# Patient Record
Sex: Female | Born: 2000 | Hispanic: No | Smoking: Never smoker
Health system: Southern US, Community
[De-identification: ages and names within clinical notes are randomized; demographics above are authoritative.]

## PROBLEM LIST (undated history)

## (undated) DIAGNOSIS — N83209 Unspecified ovarian cyst, unspecified side: Secondary | ICD-10-CM

## (undated) HISTORY — DX: Unspecified ovarian cyst, unspecified side: N83.209

---
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
## 2018-03-18 ENCOUNTER — Emergency Department
Admission: EM | Admit: 2018-03-18 | Discharge: 2018-03-18 | Disposition: A | Discharge status: Home / Self Care | Payer: Self-pay | Attending: Emergency Medicine | Admitting: Emergency Medicine

## 2018-03-18 ENCOUNTER — Emergency Department: Payer: Self-pay

## 2018-03-18 ENCOUNTER — Encounter: Payer: Self-pay | Admitting: Emergency Medicine

## 2018-03-18 ENCOUNTER — Other Ambulatory Visit: Payer: Self-pay

## 2018-03-18 DIAGNOSIS — R110 Nausea: Secondary | ICD-10-CM | POA: Insufficient documentation

## 2018-03-18 DIAGNOSIS — R102 Pelvic and perineal pain: Secondary | ICD-10-CM | POA: Insufficient documentation

## 2018-03-18 DIAGNOSIS — N83201 Unspecified ovarian cyst, right side: Secondary | ICD-10-CM | POA: Insufficient documentation

## 2018-03-18 LAB — COMPREHENSIVE METABOLIC PANEL
ALT: 22 U/L (ref 0–44)
AST: 20 U/L (ref 15–41)
Albumin: 4.2 g/dL (ref 3.5–5.0)
Alkaline Phosphatase: 42 U/L (ref 38–126)
Anion gap: 8 (ref 5–15)
BILIRUBIN TOTAL: 0.6 mg/dL (ref 0.3–1.2)
BUN: 12 mg/dL (ref 6–20)
CO2: 24 mmol/L (ref 22–32)
Calcium: 9.1 mg/dL (ref 8.9–10.3)
Chloride: 105 mmol/L (ref 98–111)
Creatinine, Ser: 0.63 mg/dL (ref 0.44–1.00)
GFR calc Af Amer: >60 mL/min (ref >60)
GFR calc non Af Amer: >60 mL/min (ref >60)
GLUCOSE: 90 mg/dL (ref 70–99)
Potassium: 3.9 mmol/L (ref 3.5–5.1)
Sodium: 137 mmol/L (ref 135–145)
TOTAL PROTEIN: 7.2 g/dL (ref 6.5–8.1)

## 2018-03-18 LAB — CBC
HCT: 43.4 % (ref 36.0–46.0)
Hemoglobin: 14.4 g/dL (ref 12.0–15.0)
MCH: 28.1 pg (ref 26.0–34.0)
MCHC: 33.2 g/dL (ref 30.0–36.0)
MCV: 84.8 fL (ref 80.0–100.0)
Platelets: 164 10*3/uL (ref 150–400)
RBC: 5.12 MIL/uL — ABNORMAL HIGH (ref 3.87–5.11)
RDW: 12.1 % (ref 11.5–15.5)
WBC: 4.1 10*3/uL (ref 4.0–10.5)
nRBC: 0 % (ref 0.0–0.2)

## 2018-03-18 LAB — LIPASE, BLOOD: Lipase: 26 U/L (ref 11–51)

## 2018-03-18 LAB — POCT PREGNANCY, URINE: Preg Test, Ur: NEGATIVE

## 2018-03-18 MED ORDER — SODIUM CHLORIDE 0.9% FLUSH: 3.0000 mL | Freq: Once | INTRAVENOUS | Status: DC

## 2018-03-18 MED ORDER — DROSPIRENONE-ETHINYL ESTRADIOL 3-0.02 MG PO TABS: 1.0000 | ORAL_TABLET | Freq: Every day | ORAL | 2 refills | Status: DC

## 2018-03-18 MED ORDER — IBUPROFEN 600 MG PO TABS: 600.0000 mg | ORAL_TABLET | Freq: Four times a day (QID) | ORAL | 0 refills | Status: DC | PRN

## 2018-03-18 NOTE — ED Notes (Signed)
Urine to lab

## 2018-03-18 NOTE — Discharge Instructions (Signed)
Take the Yaz as prescribed.  You can take ibuprofen every 6 hours as needed for pain.  Return to the ER for new, worsening, or persistent severe abdominal pain, vomiting, fever, or any other new or worsening symptoms that concern you.  Follow-up with [facility].

## 2018-03-18 NOTE — ED Notes (Signed)
Gave pt urine cup. Waiting on sample

## 2018-03-18 NOTE — ED Triage Notes (Signed)
Pt presents to ED with c/o RLQ abdominal pain. Pt states hx of ovarian cysts with hx of cyst rupture. Pt states pain started several days ago with sudden worsening today. Pt ambulatory without difficulty at this time.

## 2018-03-18 NOTE — ED Provider Notes (Signed)
[facility] [place] Emergency Department Provider Note ____________________________________________   First MD Initiated Contact with Patient 03/18/18 1452     (approximate)  I have reviewed the triage vital signs and the nursing notes.   HISTORY  Chief Complaint Abdominal Pain    HPI Kelly Harrell is a 18 y.o. female with PMH as noted below who presents with right suprapubic pain over the last 4 days, gradual onset, worsening course, associated with mild nausea but not associated with vomiting, anorexia, diarrhea, fever, urinary symptoms, or any vaginal bleeding or discharge.  She states it feels similar to when she had an ovarian cyst rupture in the past.  Past Medical History:  Diagnosis Date  . Ovarian cyst     Patient Active Problem List   Diagnosis Date Noted  . Encounter for surveillance of contraceptive pills 05/18/2017  . Hemorrhagic ovarian cyst 10/26/2016  . Abdominal pain, chronic, right lower quadrant 10/26/2016    Past Surgical History:  Procedure Laterality Date  . LAPAROSCOPY N/A 10/26/2016   Procedure: LAPAROSCOPY DIAGNOSTIC;  Surgeon: Nadara Mustard, MD;  Location: [facility] ORS;  Service: Gynecology;  Laterality: N/A;    Prior to Admission medications   Medication Sig Start Date End Date Taking? Authorizing Provider  benzonatate (TESSALON PERLES) 100 MG capsule Take 2 capsules (200 mg total) by mouth 3 (three) times daily as needed. 12/12/17 12/12/18  Joni Reining, PA-C  chlorpheniramine-HYDROcodone (TUSSIONEX PENNKINETIC ER) 10-8 MG/5ML SUER Take 5 mLs by mouth 2 (two) times daily. 12/12/17   Joni Reining, PA-C  drospirenone-ethinyl estradiol Pierre Bali) 3-0.02 MG tablet Take 1 tablet by mouth daily. 03/18/18 06/16/18  Dionne Bucy, MD  ibuprofen (ADVIL,MOTRIN) 600 MG tablet Take 1 tablet (600 mg total) by mouth every 6 (six) hours as needed. 03/18/18   Dionne Bucy, MD  methylPREDNISolone (MEDROL DOSEPAK) 4 MG  TBPK tablet Take Tapered dose as directed 12/12/17   Joni Reining, PA-C    Allergies Patient has no known allergies.  Family History  Problem Relation Age of Onset  . Hypertension Mother   . Diabetes Mother     Social History Social History   Tobacco Use  . Smoking status: Never Smoker  . Smokeless tobacco: Never Used  Substance Use Topics  . Alcohol use: No  . Drug use: No    Review of Systems  Constitutional: No fever. Eyes: No redness. ENT: No sore throat. Cardiovascular: Denies chest pain. Respiratory: Denies shortness of breath. Gastrointestinal: Positive for nausea.  No vomiting or diarrhea.  Genitourinary: Negative for dysuria or vaginal bleeding.  Musculoskeletal: Negative for back pain. Skin: Negative for rash. Neurological: Negative for headache.   ____________________________________________   PHYSICAL EXAM:  VITAL SIGNS: ED Triage Vitals  Enc Vitals Group     BP 03/18/18 1134 116/79     Pulse Rate 03/18/18 1134 89     Resp 03/18/18 1134 18     Temp 03/18/18 1134 98.3 F (36.8 C)     Temp Source 03/18/18 1134 Oral     SpO2 03/18/18 1134 100 %     Weight 03/18/18 1135 113 lb (51.3 kg)     Height 03/18/18 1135 5\' 4"  (1.626 m)     Head Circumference --      Peak Flow --      Pain Score 03/18/18 1134 10     Pain Loc --      Pain Edu? --      Excl. in GC? --  Constitutional: Alert and oriented. Well appearing and in no acute distress. Eyes: Conjunctivae are normal.  Head: Atraumatic. Nose: No congestion/rhinnorhea. Mouth/Throat: Mucous membranes are moist.   Neck: Normal range of motion.  Cardiovascular:  Good peripheral circulation. Respiratory: Normal respiratory effort.  No retractions. Gastrointestinal: Soft with mild right suprapubic tenderness. No distention.  Genitourinary: No flank tenderness. Musculoskeletal: Extremities warm and well perfused.  Neurologic:  Normal speech and language. No gross focal neurologic deficits are  appreciated.  Skin:  Skin is warm and dry. No rash noted. Psychiatric: Mood and affect are normal. Speech and behavior are normal.  ____________________________________________   LABS (all labs ordered are listed, but only abnormal results are displayed)  Labs Reviewed  CBC - Abnormal; Notable for the following components:      Result Value   RBC 5.12 (*)    All other components within normal limits  LIPASE, BLOOD  COMPREHENSIVE METABOLIC PANEL  URINALYSIS, COMPLETE (UACMP) WITH MICROSCOPIC  POC URINE PREG, ED  POCT PREGNANCY, URINE   ____________________________________________  EKG   ____________________________________________  RADIOLOGY  US pelvis transvaginal: Right ovarian hemorrhagic cyst with no free fluid  ____________________________________________   PROCEDURES  Procedure(s) performed: No  Procedures  Critical Care performed: No ____________________________________________   INITIAL IMPRESSION / ASSESSMENT AND PLAN / ED COURSE  Pertinent labs & imaging results that were available during my care of the patient were reviewed by me and considered in my medical decision making (see chart for details).  18 year old female with PMH as noted above including prior history of ovarian cyst presents with similar pain to her right lower abdomen over the last 4 days with mild nausea but no vomiting, fever chills, anorexia, urinary symptoms, or any bleeding or discharge.  On exam the patient is well-appearing and her vital signs are normal.  The abdomen is soft with mild right suprapubic tenderness.  Lab work-up obtained from triage is within normal limits.  Overall given the nature of the symptoms, the similarity to prior ovarian cyst related pain, the lack of fever, anorexia, or elevated WBC count, as well as the relatively inferior location of the pain, I suspect most likely ovarian cyst rather than appendicitis.  Given the patient's well appearance and the  duration and quality of the pain there is also no evidence for ovarian torsion.  We will obtain a pelvic ultrasound to further evaluate.  If the ultrasound is entirely negative I may consider CT at that time.  ----------------------------------------- 6:39 PM on 03/18/2018 -----------------------------------------  Ultrasound confirms right ovarian cyst with no surrounding free fluid or other acute abnormalities.  Appearance is unchanged from prior imaging.  The patient continues to appear comfortable and has not required any analgesia here.  At this time the patient is I discussed the case with Dr. Gaynelle Arabian from [facility] ob/gyn who agreed with my suggestion to restart the patient on Yaz which she had been taking previously.  The patient will follow up there.  Return precautions given, and she expressed understanding.  ____________________________________________   FINAL CLINICAL IMPRESSION(S) / ED DIAGNOSES  Final diagnoses:  Pelvic pain  Cyst of right ovary      NEW MEDICATIONS STARTED DURING THIS VISIT:  Discharge Medication List as of 03/18/2018  6:01 PM    START taking these medications   Details  ibuprofen (ADVIL,MOTRIN) 600 MG tablet Take 1 tablet (600 mg total) by mouth every 6 (six) hours as needed., Starting Thu 03/18/2018, Normal         Note:  This document was prepared using Dragon voice recognition software and may include unintentional dictation errors.    Dionne Bucy, MD 03/18/18 1842

## 2018-10-06 ENCOUNTER — Other Ambulatory Visit: Payer: Self-pay

## 2018-10-06 DIAGNOSIS — W228XXA Striking against or struck by other objects, initial encounter: Secondary | ICD-10-CM | POA: Insufficient documentation

## 2018-10-06 DIAGNOSIS — Z79899 Other long term (current) drug therapy: Secondary | ICD-10-CM | POA: Insufficient documentation

## 2018-10-06 DIAGNOSIS — G43801 Other migraine, not intractable, with status migrainosus: Secondary | ICD-10-CM | POA: Insufficient documentation

## 2018-10-06 DIAGNOSIS — Y929 Unspecified place or not applicable: Secondary | ICD-10-CM | POA: Insufficient documentation

## 2018-10-06 DIAGNOSIS — Y939 Activity, unspecified: Secondary | ICD-10-CM | POA: Insufficient documentation

## 2018-10-06 MED ORDER — ACETAMINOPHEN 325 MG PO TABS
650.0000 mg | ORAL_TABLET | Freq: Once | ORAL | Status: DC
  Filled 2018-10-06: qty 2

## 2018-10-06 NOTE — ED Triage Notes (Signed)
She was playing with nephew yesterday and he hit her to right forehead with a toy. Since then has had migraine, pt has hx of the same.

## 2018-10-07 ENCOUNTER — Emergency Department
Admission: EM | Admit: 2018-10-07 | Discharge: 2018-10-07 | Disposition: A | Discharge status: Home / Self Care | Payer: Medicaid Other | Attending: Emergency Medicine | Admitting: Emergency Medicine

## 2018-10-07 DIAGNOSIS — G43801 Other migraine, not intractable, with status migrainosus: Secondary | ICD-10-CM

## 2018-10-07 LAB — POCT PREGNANCY, URINE: Preg Test, Ur: NEGATIVE

## 2018-10-07 MED ORDER — KETOROLAC TROMETHAMINE 30 MG/ML IJ SOLN
15.0000 mg | Freq: Once | INTRAMUSCULAR | Status: AC
  Administered 2018-10-07: 15 mg via INTRAVENOUS
  Filled 2018-10-07: qty 1

## 2018-10-07 MED ORDER — METOCLOPRAMIDE HCL 5 MG/ML IJ SOLN
10.0000 mg | Freq: Once | INTRAMUSCULAR | Status: AC
  Administered 2018-10-07: 10 mg via INTRAVENOUS
  Filled 2018-10-07: qty 2

## 2018-10-07 MED ORDER — BUTALBITAL-APAP-CAFFEINE 50-325-40 MG PO TABS
1.0000 | ORAL_TABLET | Freq: Once | ORAL | Status: AC
  Administered 2018-10-07: 1 via ORAL
  Filled 2018-10-07: qty 1

## 2018-10-07 MED ORDER — BUTALBITAL-APAP-CAFFEINE 50-325-40 MG PO TABS: 1.0000 | ORAL_TABLET | Freq: Four times a day (QID) | ORAL | 0 refills | Status: DC | PRN

## 2018-10-07 MED ORDER — SODIUM CHLORIDE 0.9 % IV BOLUS
1000.0000 mL | Freq: Once | INTRAVENOUS | Status: AC
  Administered 2018-10-07: 1000 mL via INTRAVENOUS

## 2018-10-07 NOTE — ED Provider Notes (Signed)
[facility] Emergency Department Provider Note  ____________________________________________  Time seen: Approximately 3:44 AM  I have reviewed the triage vital signs and the nursing notes.   HISTORY  Chief Complaint Headache   HPI Kelly Harrell is a 18 y.o. female with a history of concussion and migraines who presents for evaluation of a headache.  Patient reports history of frequent headaches since having a concussion a few years ago.  Yesterday her nephew was playing with toys and threw a heavy plastic horse toy that hit her on the right side of her head.  Since then she has had headache.  She is complaining of phonophobia and photophobia and a visual aura which is consistent with her migraine headaches.  She took Tylenol at home.  She denies thunderclap headache, neck stiffness, fever, chills, syncope, changes in vision.   Past Medical History:  Diagnosis Date  . Ovarian cyst     Patient Active Problem List   Diagnosis Date Noted  . Encounter for surveillance of contraceptive pills 05/18/2017  . Hemorrhagic ovarian cyst 10/26/2016  . Abdominal pain, chronic, right lower quadrant 10/26/2016    Past Surgical History:  Procedure Laterality Date  . LAPAROSCOPY N/A 10/26/2016   Procedure: LAPAROSCOPY DIAGNOSTIC;  Surgeon: Nadara MustardHarris, Robert P, MD;  Location: [facility] ORS;  Service: Gynecology;  Laterality: N/A;    Prior to Admission medications   Medication Sig Start Date End Date Taking? Authorizing Provider  benzonatate (TESSALON PERLES) 100 MG capsule Take 2 capsules (200 mg total) by mouth 3 (three) times daily as needed. 12/12/17 12/12/18  Joni ReiningSmith, Ronald K, PA-C  butalbital-acetaminophen-caffeine (FIORICET) [phone] MG tablet Take 1 tablet by mouth every 6 (six) hours as needed for headache. 10/07/18 10/07/19  Nita SickleVeronese, [place], MD  chlorpheniramine-HYDROcodone [facility](TUSSIONEX PENNKINETIC ER) 10-8 MG/5ML SUER Take 5 mLs by mouth 2 (two) times daily. 12/12/17    Joni ReiningSmith, Ronald K, PA-C  drospirenone-ethinyl estradiol Pierre Bali(YAZ,GIANVI,LORYNA) 3-0.02 MG tablet Take 1 tablet by mouth daily. 03/18/18 06/16/18  Dionne BucySiadecki, Sebastian, MD  ibuprofen (ADVIL,MOTRIN) 600 MG tablet Take 1 tablet (600 mg total) by mouth every 6 (six) hours as needed. 03/18/18   Dionne BucySiadecki, Sebastian, MD  methylPREDNISolone (MEDROL DOSEPAK) 4 MG TBPK tablet Take Tapered dose as directed 12/12/17   Joni ReiningSmith, Ronald K, PA-C    Allergies Patient has no known allergies.  Family History  Problem Relation Age of Onset  . Hypertension Mother   . Diabetes Mother     Social History Social History   Tobacco Use  . Smoking status: Never Smoker  . Smokeless tobacco: Never Used  Substance Use Topics  . Alcohol use: No  . Drug use: No    Review of Systems  Constitutional: Negative for fever. Eyes: Negative for visual changes. ENT: Negative for sore throat. Neck: No neck pain  Cardiovascular: Negative for chest pain. Respiratory: Negative for shortness of breath. Gastrointestinal: Negative for abdominal pain, vomiting or diarrhea. Genitourinary: Negative for dysuria. Musculoskeletal: Negative for back pain. Skin: Negative for rash. Neurological: Negative for  weakness or numbness. + HA Psych: No SI or HI  ____________________________________________   PHYSICAL EXAM:  VITAL SIGNS: ED Triage Vitals [10/06/18 2230]  Enc Vitals Group     BP 112/75     Pulse Rate 85     Resp 20     Temp 98.4 F (36.9 C)     Temp Source Oral     SpO2 100 %     Weight 110 lb (49.9 kg)  Height 5\' 4"  (1.626 m)     Head Circumference      Peak Flow      Pain Score 10     Pain Loc      Pain Edu?      Excl. in GC?     Constitutional: Alert and oriented. Well appearing and in no apparent distress. HEENT:      Head: Normocephalic and atraumatic.         Eyes: Conjunctivae are normal. Sclera is non-icteric.       Mouth/Throat: Mucous membranes are moist.       Neck: Supple with no signs of  meningismus. No cspine tenderness Cardiovascular: Regular rate and rhythm. No murmurs, gallops, or rubs. 2+ symmetrical distal pulses are present in all extremities. No JVD. Respiratory: Normal respiratory effort. Lungs are clear to auscultation bilaterally. No wheezes, crackles, or rhonchi.  Gastrointestinal: Soft, non tender, and non distended with positive bowel sounds. No rebound or guarding. Musculoskeletal: Nontender with normal range of motion in all extremities. No edema, cyanosis, or erythema of extremities. Neurologic: Normal speech and language. Face is symmetric. Moving all extremities.  Intact strength and sensation x4.  No gross focal neurologic deficits are appreciated. Skin: Skin is warm, dry and intact. No rash noted. Psychiatric: Mood and affect are normal. Speech and behavior are normal.  ____________________________________________   LABS (all labs ordered are listed, but only abnormal results are displayed)  Labs Reviewed  POCT PREGNANCY, URINE   ____________________________________________  EKG  none  ____________________________________________  RADIOLOGY  none  ____________________________________________   PROCEDURES  Procedure(s) performed: None Procedures Critical Care performed:  None ____________________________________________   INITIAL IMPRESSION / ASSESSMENT AND PLAN / ED COURSE  18 y.o. female with a history of concussion and migraines who presents for evaluation of a headache consisting with her chronic migraines.  Low suspicion for more serious or life threatening etiology of HA based on history and exam. No sudden onset thunderclap HA, onset with exertion, vomiting, focal neurologic deficits, to suggest increased risk of subarachnoid hemorrhage. No fever, neck pain, neck stiffness, or meningismus on exam to suggest meningitis. No fevers, altered mental status, unusual behavior to suggest encephalitis. No focal neurologic deficits by  history or exam to suggest central venous thrombosis. No constitutional symptoms including fever, fatigue, weight loss, temporal scalp tenderness, jaw claudication, visual loss, to suggest temporal arteritis. No immunocompromise to suggest increased risk for intracranial infectious disease. No visual changes or findings on ocular exam to suggest acute angle closure glaucoma. No reports of toxic exposures including carbon monoxide or other household members with similar symptoms.  Patient was given migraine cocktail with full resolution of her symptoms.  Discussed follow-up with primary care doctor.  Discussed my standard return precautions.  Patient was discharged with a prescription for Fioricet.       As part of my medical decision making, I reviewed the following data within the electronic MEDICAL RECORD NUMBER Nursing notes reviewed and incorporated, Old chart reviewed, Notes from prior ED visits and [place] Controlled Substance Database   Patient was evaluated in Emergency Department today for the symptoms described in the history of present illness. Patient was evaluated in the context of the global COVID-19 pandemic, which necessitated consideration that the patient might be at risk for infection with the SARS-CoV-2 virus that causes COVID-19. Institutional protocols and algorithms that pertain to the evaluation of patients at risk for COVID-19 are in a state of rapid change based on information released by regulatory  bodies including the CDC and federal and state organizations. These policies and algorithms were followed during the patient's care in the ED.   ____________________________________________   FINAL CLINICAL IMPRESSION(S) / ED DIAGNOSES   Final diagnoses:  Other migraine with status migrainosus, not intractable      NEW MEDICATIONS STARTED DURING THIS VISIT:  ED Discharge Orders         Ordered    butalbital-acetaminophen-caffeine (FIORICET) 50-325-40 MG tablet  Every 6 hours  PRN     10/07/18 0343           Note:  This document was prepared using Dragon voice recognition software and may include unintentional dictation errors.    Rudene Re, MD 10/07/18 [phone]

## 2018-11-22 ENCOUNTER — Other Ambulatory Visit: Payer: Self-pay | Admitting: Obstetrics & Gynecology

## 2018-12-30 ENCOUNTER — Other Ambulatory Visit: Payer: Self-pay | Admitting: Obstetrics & Gynecology

## 2019-03-21 ENCOUNTER — Other Ambulatory Visit: Payer: Self-pay | Admitting: Obstetrics & Gynecology

## 2019-04-12 ENCOUNTER — Ambulatory Visit: Payer: Medicaid Other | Attending: Internal Medicine

## 2019-04-12 DIAGNOSIS — Z23 Encounter for immunization: Secondary | ICD-10-CM

## 2019-04-12 NOTE — Progress Notes (Signed)
   Covid-19 Vaccination Clinic  Name:  Kelly Harrell    MRN: [id number] DOB: [birth year]  04/12/2019  [name] was observed post Covid-19 immunization for 15 minutes without incident. She was provided with Vaccine Information Sheet and instruction to access the V-Safe system.   [name] was instructed to call 911 with any severe reactions post vaccine: Marland Kitchen Difficulty breathing  . Swelling of face and throat  . A fast heartbeat  . A bad rash all over body  . Dizziness and weakness   Immunizations Administered    Name Date Dose VIS Date Route   Moderna COVID-19 Vaccine 04/12/2019  3:23 PM 0.5 mL 12/07/2018 Intramuscular   Manufacturer: Moderna   Lot: [id number]   NDC: 36438-377-93

## 2019-05-10 ENCOUNTER — Ambulatory Visit: Payer: Medicaid Other | Attending: Internal Medicine

## 2019-05-10 DIAGNOSIS — Z23 Encounter for immunization: Secondary | ICD-10-CM

## 2019-05-10 NOTE — Progress Notes (Signed)
   Covid-19 Vaccination Clinic  Name:  Kelly Harrell    MRN: [id number] DOB: 05-09-2000  05/10/2019  [name] was observed post Covid-19 immunization for 15 minutes without incident. She was provided with Vaccine Information Sheet and instruction to access the V-Safe system.   [name] was instructed to call 911 with any severe reactions post vaccine: Marland Kitchen Difficulty breathing  . Swelling of face and throat  . A fast heartbeat  . A bad rash all over body  . Dizziness and weakness   Immunizations Administered    Name Date Dose VIS Date Route   Moderna COVID-19 Vaccine 05/10/2019  3:04 PM 0.5 mL 12/2018 Intramuscular   Manufacturer: Moderna   Lot: [id number]   NDC: 73543-014-84

## 2019-05-12 ENCOUNTER — Encounter: Payer: Self-pay | Admitting: Obstetrics & Gynecology

## 2019-05-12 ENCOUNTER — Other Ambulatory Visit (HOSPITAL_COMMUNITY)
Admission: RE | Admit: 2019-05-12 | Discharge: 2019-05-12 | Disposition: A | Discharge status: Home / Self Care | Payer: Medicaid Other | Source: Ambulatory Visit | Attending: Obstetrics & Gynecology | Admitting: Obstetrics & Gynecology

## 2019-05-12 ENCOUNTER — Ambulatory Visit (INDEPENDENT_AMBULATORY_CARE_PROVIDER_SITE_OTHER): Payer: Medicaid Other | Admitting: Obstetrics & Gynecology

## 2019-05-12 ENCOUNTER — Other Ambulatory Visit: Payer: Self-pay

## 2019-05-12 VITALS — BP 120/80 | Ht 64.0 in | Wt 108.0 lb

## 2019-05-12 DIAGNOSIS — Z01419 Encounter for gynecological examination (general) (routine) without abnormal findings: Secondary | ICD-10-CM | POA: Diagnosis not present

## 2019-05-12 DIAGNOSIS — Z124 Encounter for screening for malignant neoplasm of cervix: Secondary | ICD-10-CM

## 2019-05-12 DIAGNOSIS — Z3041 Encounter for surveillance of contraceptive pills: Secondary | ICD-10-CM

## 2019-05-12 DIAGNOSIS — Z0000 Encounter for general adult medical examination without abnormal findings: Secondary | ICD-10-CM

## 2019-05-12 MED ORDER — DROSPIRENONE-ETHINYL ESTRADIOL 3-0.02 MG PO TABS: 1.0000 | ORAL_TABLET | Freq: Every day | ORAL | 3 refills | Status: DC

## 2019-05-12 NOTE — Progress Notes (Signed)
HPI:      Kelly Harrell is a 19 y.o. G0 who LMP was Patient's last menstrual period was 04/27/2019., she presents today for her annual examination. The patient has no complaints today, more pain w periods and heavier cycles off of OCP (stopped as Rx ran out).  Prior h/o ovarian cysts. The patient is not sexually active. Her no prior history of gyn screening tests. The patient does not perform self breast exams.  There is no notable family history of breast or ovarian cancer in her family.  The patient has regular exercise: yes.  The patient denies current symptoms of depression.    GYN History: Contraception: abstinence  PMHx: Past Medical History:  Diagnosis Date  . Ovarian cyst    Past Surgical History:  Procedure Laterality Date  . LAPAROSCOPY N/A 10/26/2016   Procedure: LAPAROSCOPY DIAGNOSTIC;  Surgeon: Gae Dry, MD;  Location: [facility] ORS;  Service: Gynecology;  Laterality: N/A;   Family History  Problem Relation Age of Onset  . Hypertension Mother   . Diabetes Mother    Social History   Tobacco Use  . Smoking status: Never Smoker  . Smokeless tobacco: Never Used  Substance Use Topics  . Alcohol use: No  . Drug use: No    Current Outpatient Medications:  .  drospirenone-ethinyl estradiol (YAZ) 3-0.02 MG tablet, Take 1 tablet by mouth daily., Disp: 84 tablet, Rfl: 3 Allergies: Patient has no known allergies.  Review of Systems  Constitutional: Negative for chills, fever and malaise/fatigue.  HENT: Negative for congestion, sinus pain and sore throat.   Eyes: Negative for blurred vision and pain.  Respiratory: Negative for cough and wheezing.   Cardiovascular: Positive for chest pain. Negative for leg swelling.  Gastrointestinal: Negative for abdominal pain, constipation, diarrhea, heartburn, nausea and vomiting.  Genitourinary: Negative for dysuria, frequency, hematuria and urgency.  Musculoskeletal: Negative for back pain, joint pain, myalgias and neck  pain.  Skin: Negative for itching and rash.  Neurological: Negative for dizziness, tremors and weakness.  Endo/Heme/Allergies: Does not bruise/bleed easily.  Psychiatric/Behavioral: Negative for depression. The patient is not nervous/anxious and does not have insomnia.     Objective: BP 120/80   Ht 5\' 4"  (1.626 m)   Wt 108 lb (49 kg)   LMP 04/27/2019   BMI 18.54 kg/m   Filed Weights   05/12/19 0912  Weight: 108 lb (49 kg)   Body mass index is 18.54 kg/m. Physical Exam Constitutional:      General: She is not in acute distress.    Appearance: She is well-developed.  Genitourinary:     Pelvic exam was performed with patient supine.     Vagina, uterus and rectum normal.     No lesions in the vagina.     No vaginal bleeding.     No cervical motion tenderness, friability, lesion or polyp.     Uterus is mobile.     Uterus is not enlarged.     No uterine mass detected.    Uterus is midaxial.     No right or left adnexal mass present.     Right adnexa not tender.     Left adnexa not tender.  HENT:     Head: Normocephalic and atraumatic. No laceration.     Right Ear: Hearing normal.     Left Ear: Hearing normal.     Mouth/Throat:     Pharynx: Uvula midline.  Eyes:     Pupils: Pupils are equal,  round, and reactive to light.  Neck:     Thyroid: No thyromegaly.  Cardiovascular:     Rate and Rhythm: Normal rate and regular rhythm.     Heart sounds: No murmur. No friction rub. No gallop.   Pulmonary:     Effort: Pulmonary effort is normal. No respiratory distress.     Breath sounds: Normal breath sounds. No wheezing.  Chest:     Breasts:        Right: No mass, skin change or tenderness.        Left: No mass, skin change or tenderness.  Abdominal:     General: Bowel sounds are normal. There is no distension.     Palpations: Abdomen is soft.     Tenderness: There is no abdominal tenderness. There is no rebound.  Musculoskeletal:        General: Normal range of motion.      Cervical back: Normal range of motion and neck supple.  Neurological:     Mental Status: She is alert and oriented to person, place, and time.     Cranial Nerves: No cranial nerve deficit.  Skin:    General: Skin is warm and dry.  Psychiatric:        Judgment: Judgment normal.  Vitals reviewed.     Assessment:  ANNUAL EXAM 1. Women's annual routine gynecological examination   2. Screening for cervical cancer   3. Encounter for surveillance of contraceptive pills      Screening Plan:            1.  Cervical Screening-  Pap smear done today  2. Labs managed by PCP  3. Counseling for contraception: oral contraceptives (estrogen/progesterone) for period and cyst control - drospirenone-ethinyl estradiol (YAZ) 3-0.02 MG tablet; Take 1 tablet by mouth daily.  Dispense: 84 tablet; Refill: 3   4. Pt reports one year h/o intermittent chest wall discomfort.  Have provided list of PCP to see and evaluate this concern.  If wornens prior to this appt, to ER or cardiology referral      F/U  Return in about 1 year (around 05/11/2020) for Annual.  Annamarie Major, MD, [facility], [facility] 05/12/2019  9:46 AM

## 2019-05-12 NOTE — Patient Instructions (Addendum)
Primary Care in the area  This is not meant to be comprehensive list, but a resource for some providers that you can call for counseling or medication needs. If you have a recommendation, please let us know.   [facility]:  [phone]               Dr Julieanne Manson               Dr Jamse [place]               Dr Shirlee Latch  [facility]:  [phone]  Dr Danna Hefty [facility], [place]:   [phone]  Hannah Beat, MD  Gweneth Dimitri, MD  Eustaquio Boyden, MD  Excell Seltzer, MD  [facility], [place]: [phone]  Quentin Ore, MD    Health Maintenance, Female Adopting a healthy lifestyle and getting preventive care are important in promoting health and wellness. Ask your health care provider about:  The right schedule for you to have regular tests and exams.  Things you can do on your own to prevent diseases and keep yourself healthy. What should I know about diet, weight, and exercise? Eat a healthy diet   Eat a diet that includes plenty of vegetables, fruits, low-fat dairy products, and lean protein.  Do not eat a lot of foods that are high in solid fats, added sugars, or sodium. Maintain a healthy weight Body mass index (BMI) is used to identify weight problems. It estimates body fat based on height and weight. Your health care provider can help determine your BMI and help you achieve or maintain a healthy weight. Get regular exercise Get regular exercise. This is one of the most important things you can do for your health. Most adults should:  Exercise for at least 150 minutes each week. The exercise should increase your heart rate and make you sweat (moderate-intensity exercise).  Do strengthening exercises at least twice a week. This is in addition to the moderate-intensity exercise.  Spend less time sitting. Even light physical activity can be beneficial. Watch cholesterol  and blood lipids Have your blood tested for lipids and cholesterol at 21 years of age, then have this test every 5 years. Have your cholesterol levels checked more often if:  Your lipid or cholesterol levels are high.  You are older than 21 years of age.  You are at high risk for heart disease. What should I know about cancer screening? Depending on your health history and family history, you may need to have cancer screening at various ages. This may include screening for:  Breast cancer.  Cervical cancer.  Colorectal cancer.  Skin cancer.  Lung cancer. What should I know about heart disease, diabetes, and high blood pressure? Blood pressure and heart disease  High blood pressure causes heart disease and increases the risk of stroke. This is more likely to develop in people who have high blood pressure readings, are of African descent, or are overweight.  Have your blood pressure checked: ? Every 3-5 years if you are 103-39 years of age. ? Every year if you are 48 years old or older. Diabetes Have regular diabetes screenings. This checks your fasting blood sugar level. Have the screening done:  Once every three years after age 55 if you are at a normal weight and have a low risk for diabetes.  More often and at a younger age if you are overweight or  have a high risk for diabetes. What should I know about preventing infection? Hepatitis B If you have a higher risk for hepatitis B, you should be screened for this virus. Talk with your health care provider to find out if you are at risk for hepatitis B infection. Hepatitis C Testing is recommended for:  Everyone born from [birth year] through 1967.  Anyone with known risk factors for hepatitis C. Sexually transmitted infections (STIs)  Get screened for STIs, including gonorrhea and chlamydia, if: ? You are sexually active and are younger than 21 years of age. ? You are older than 21 years of age and your health care provider  tells you that you are at risk for this type of infection. ? Your sexual activity has changed since you were last screened, and you are at increased risk for chlamydia or gonorrhea. Ask your health care provider if you are at risk.  Ask your health care provider about whether you are at high risk for HIV. Your health care provider may recommend a prescription medicine to help prevent HIV infection. If you choose to take medicine to prevent HIV, you should first get tested for HIV. You should then be tested every 3 months for as long as you are taking the medicine. Pregnancy  If you are about to stop having your period (premenopausal) and you may become pregnant, seek counseling before you get pregnant.  Take 400 to 800 micrograms (mcg) of folic acid every day if you become pregnant.  Ask for birth control (contraception) if you want to prevent pregnancy. Osteoporosis and menopause Osteoporosis is a disease in which the bones lose minerals and strength with aging. This can result in bone fractures. If you are 56 years old or older, or if you are at risk for osteoporosis and fractures, ask your health care provider if you should:  Be screened for bone loss.  Take a calcium or vitamin D supplement to lower your risk of fractures.  Be given hormone replacement therapy (HRT) to treat symptoms of menopause. Follow these instructions at home: Lifestyle  Do not use any products that contain nicotine or tobacco, such as cigarettes, e-cigarettes, and chewing tobacco. If you need help quitting, ask your health care provider.  Do not use street drugs.  Do not share needles.  Ask your health care provider for help if you need support or information about quitting drugs. Alcohol use  Do not drink alcohol if: ? Your health care provider tells you not to drink. ? You are pregnant, may be pregnant, or are planning to become pregnant.  If you drink alcohol: ? Limit how much you use to 0-1 drink a  day. ? Limit intake if you are breastfeeding.  Be aware of how much alcohol is in your drink. In the [place] one drink equals one 12 oz bottle of beer (355 mL), one 5 oz glass of wine (148 mL), or one 1 oz glass of hard liquor (44 mL). General instructions  Schedule regular health, dental, and eye exams.  Stay current with your vaccines.  Tell your health care provider if: ? You often feel depressed. ? You have ever been abused or do not feel safe at home. Summary  Adopting a healthy lifestyle and getting preventive care are important in promoting health and wellness.  Follow your health care provider's instructions about healthy diet, exercising, and getting tested or screened for diseases.  Follow your health care provider's instructions on monitoring your cholesterol and blood pressure.  This information is not intended to replace advice given to you by your health care provider. Make sure you discuss any questions you have with your health care provider. Document Revised: 12/16/2017 Document Reviewed: 12/16/2017 Elsevier Patient Education  2020 [employer].

## 2019-05-13 LAB — CYTOLOGY - PAP
Chlamydia: NEGATIVE
Comment: NEGATIVE
Comment: NEGATIVE
Comment: NORMAL
Diagnosis: NEGATIVE
Neisseria Gonorrhea: NEGATIVE
Trichomonas: NEGATIVE

## 2019-07-07 DIAGNOSIS — Z419 Encounter for procedure for purposes other than remedying health state, unspecified: Secondary | ICD-10-CM | POA: Diagnosis not present

## 2019-08-07 DIAGNOSIS — Z419 Encounter for procedure for purposes other than remedying health state, unspecified: Secondary | ICD-10-CM | POA: Diagnosis not present

## 2019-09-07 DIAGNOSIS — Z419 Encounter for procedure for purposes other than remedying health state, unspecified: Secondary | ICD-10-CM | POA: Diagnosis not present

## 2019-10-07 DIAGNOSIS — Z419 Encounter for procedure for purposes other than remedying health state, unspecified: Secondary | ICD-10-CM | POA: Diagnosis not present

## 2019-10-14 DIAGNOSIS — Z111 Encounter for screening for respiratory tuberculosis: Secondary | ICD-10-CM | POA: Diagnosis not present

## 2019-10-14 DIAGNOSIS — Z23 Encounter for immunization: Secondary | ICD-10-CM | POA: Diagnosis not present

## 2019-10-17 DIAGNOSIS — Z111 Encounter for screening for respiratory tuberculosis: Secondary | ICD-10-CM | POA: Diagnosis not present

## 2019-11-07 DIAGNOSIS — Z419 Encounter for procedure for purposes other than remedying health state, unspecified: Secondary | ICD-10-CM | POA: Diagnosis not present

## 2019-12-07 DIAGNOSIS — Z419 Encounter for procedure for purposes other than remedying health state, unspecified: Secondary | ICD-10-CM | POA: Diagnosis not present

## 2020-01-07 DIAGNOSIS — Z419 Encounter for procedure for purposes other than remedying health state, unspecified: Secondary | ICD-10-CM | POA: Diagnosis not present

## 2020-02-07 DIAGNOSIS — Z419 Encounter for procedure for purposes other than remedying health state, unspecified: Secondary | ICD-10-CM | POA: Diagnosis not present

## 2020-03-06 DIAGNOSIS — Z419 Encounter for procedure for purposes other than remedying health state, unspecified: Secondary | ICD-10-CM | POA: Diagnosis not present

## 2020-04-04 IMAGING — US US PELVIS COMPLETE
1 series · 14 of 25 positions shown · non-contrast
Comparison: 11/02/2016 CT

CLINICAL DATA: Right-sided pelvic pain

EXAM:
TRANSABDOMINAL ULTRASOUND OF PELVIS
TECHNIQUE: Transabdominal ultrasound examination of the pelvis was performed
including evaluation of the uterus, ovaries, adnexal regions, and
pelvic cul-de-sac.

[Series 1: us pelvis complete · 0.18mm/px · 14 of 51 slices shown]
[im 1/51]
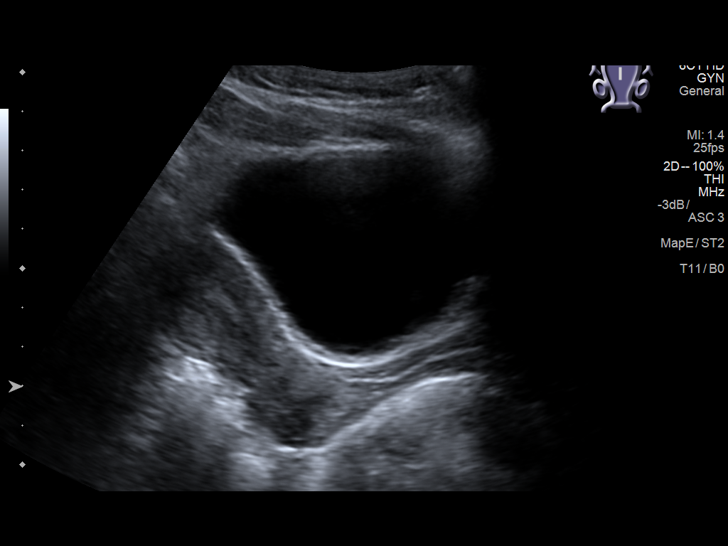
[im 5/51]
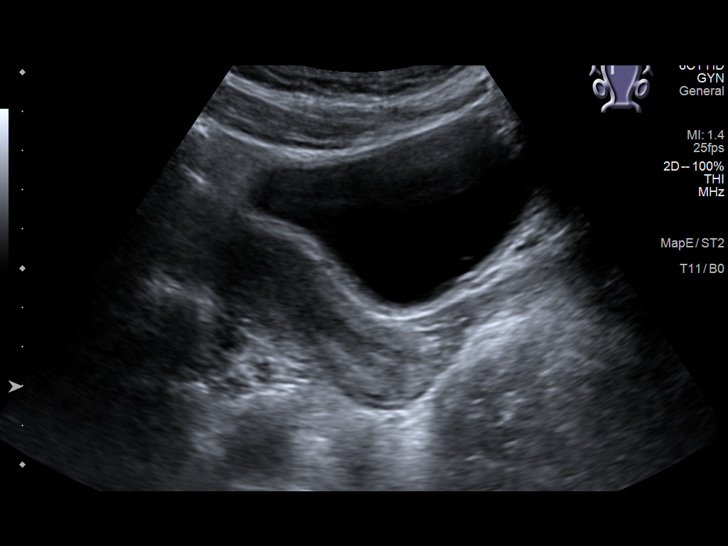
[im 9/51]
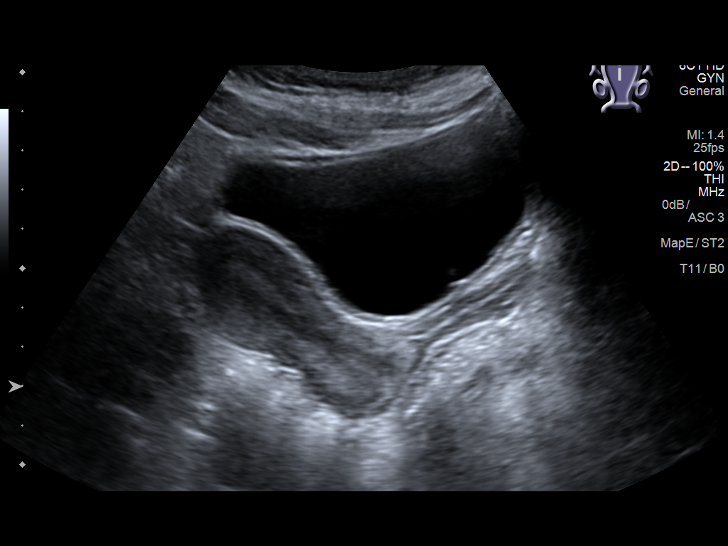
[im 13/51]
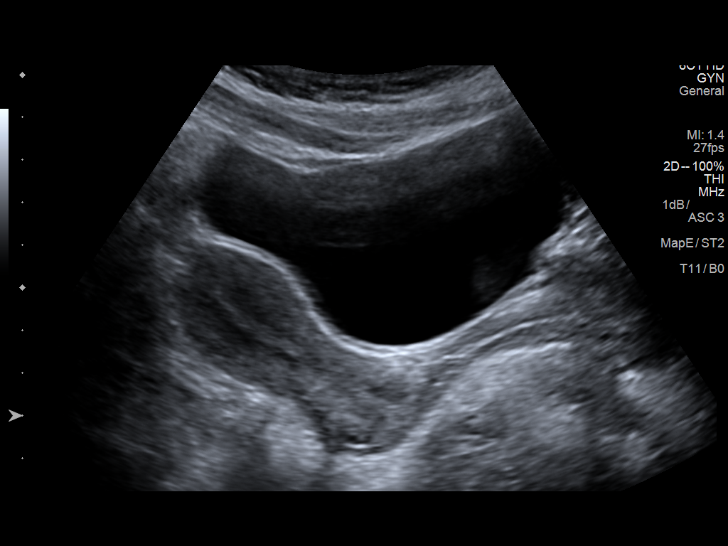
[im 17/51]
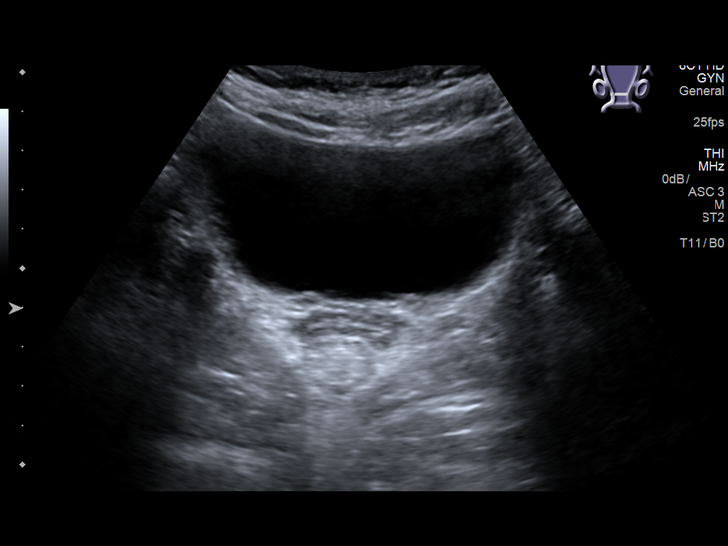
[im 19/51]
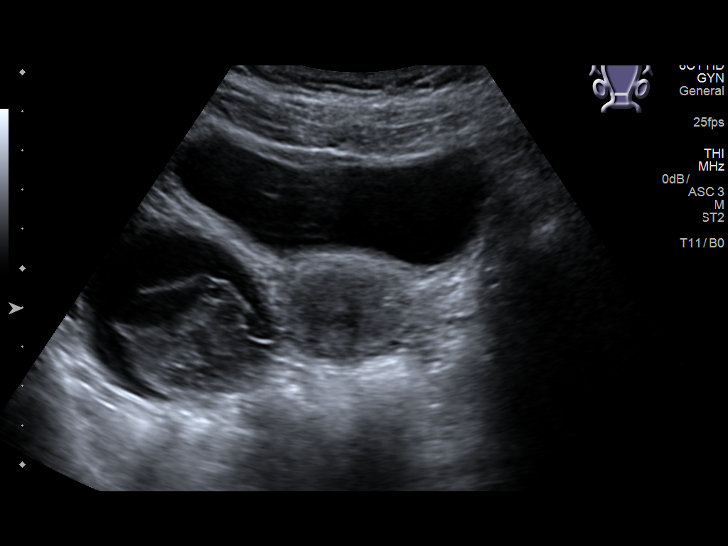
[im 23/51]
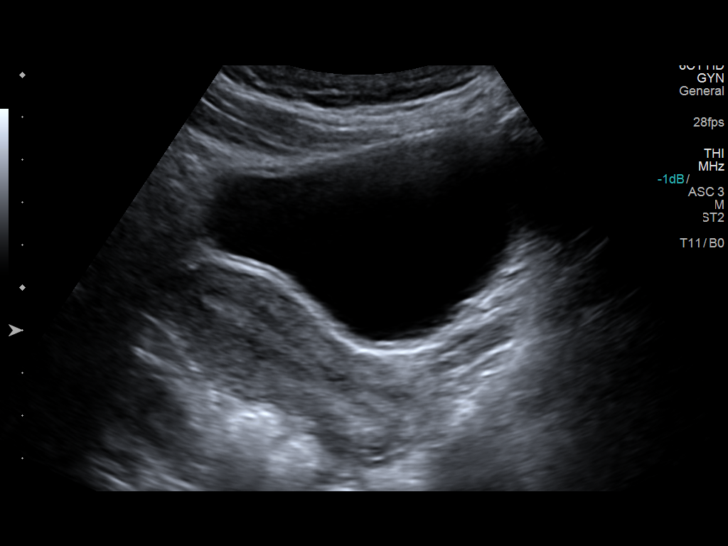
[im 28/51]
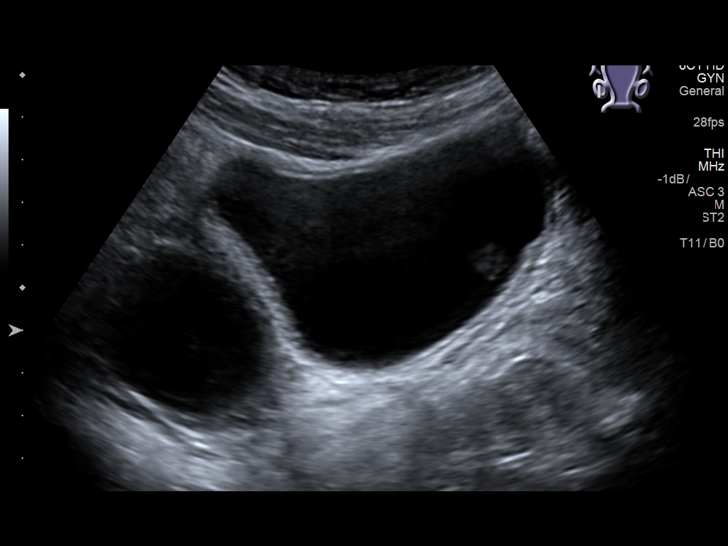
[im 32/51]
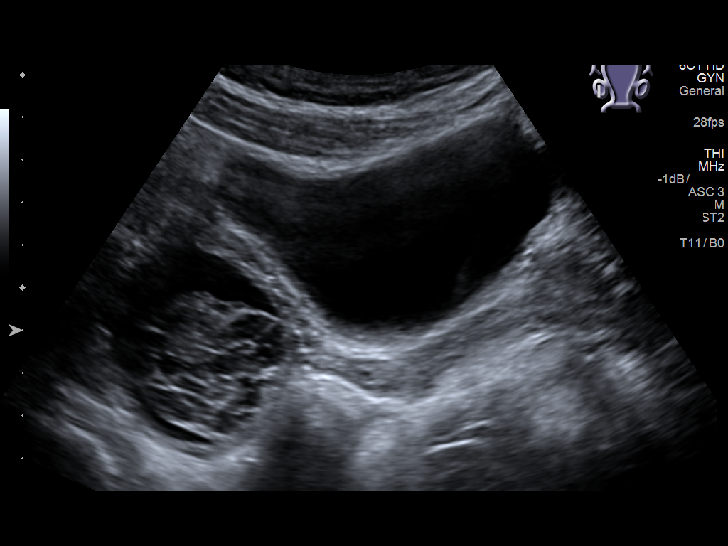
[im 34/51]
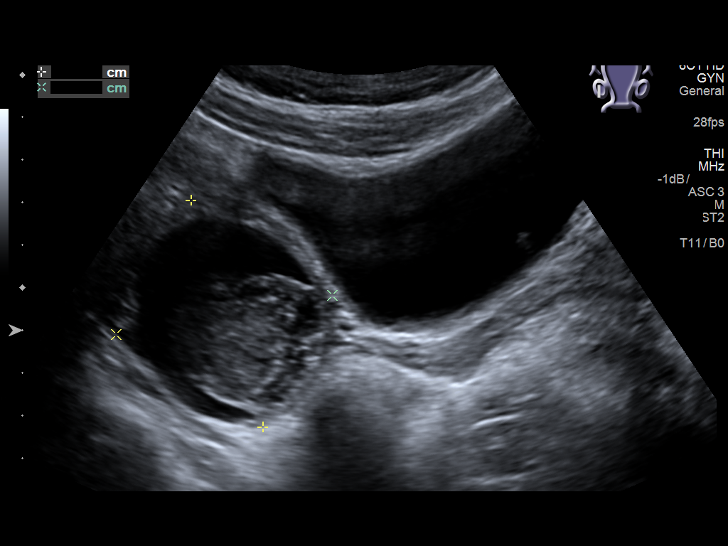
[im 38/51]
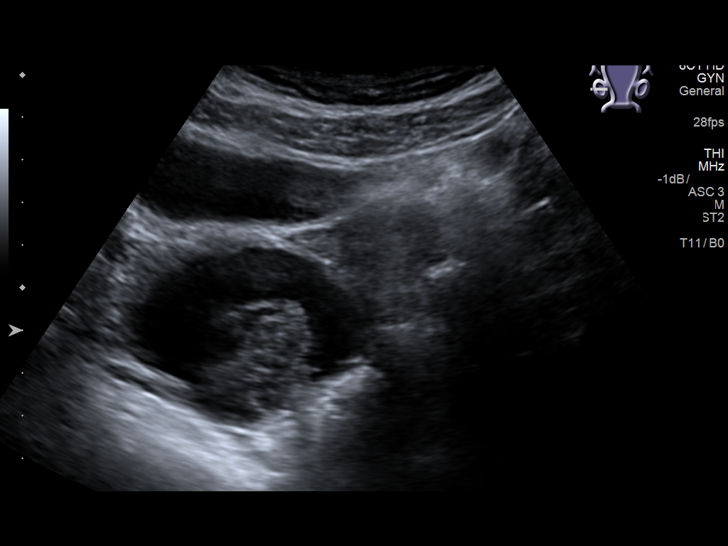
[im 42/51]
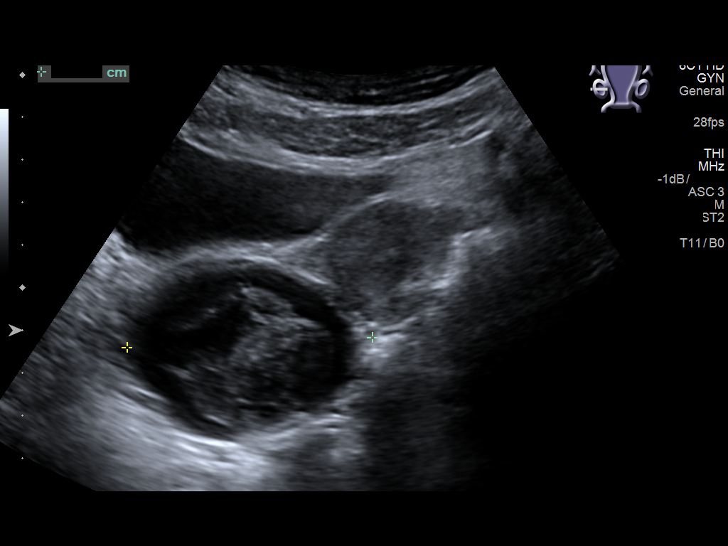
[im 46/51]
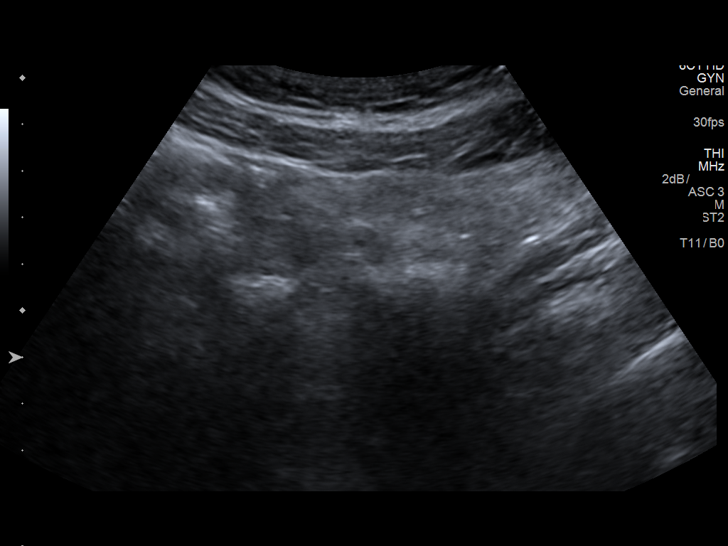
[im 51/51]
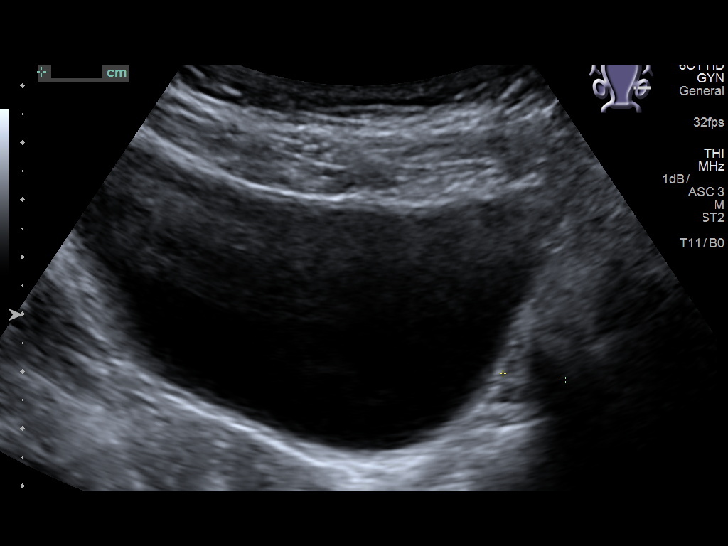

[14 of 25 positions shown; findings below may reference images not displayed]

FINDINGS: Uterus

Measurements: 6.4 x 2.7 x 3.6 cm. = volume: 32 mL. No fibroids or
other mass visualized.

Endometrium

Thickness: 4 mm.  No focal abnormality visualized.

Right ovary

Measurements: 5.6 x 5.2 x 5.8 cm = volume: 83 mL. The ovary has a
complex appearance similar to that seen on previous CT. This is
again consistent with a hemorrhagic cyst.

Left ovary

Measurements: 2.6 x 1.1 x 1.1 cm = volume: 1.6 mL. Normal
appearance/no adnexal mass.

Other findings:  No abnormal free fluid.
IMPRESSION: Stable appearing right hemorrhagic cyst over multiple years. No free
fluid to suggest acute hemorrhage is noted.

## 2020-04-06 DIAGNOSIS — Z419 Encounter for procedure for purposes other than remedying health state, unspecified: Secondary | ICD-10-CM | POA: Diagnosis not present

## 2020-05-06 DIAGNOSIS — Z419 Encounter for procedure for purposes other than remedying health state, unspecified: Secondary | ICD-10-CM | POA: Diagnosis not present

## 2020-06-06 DIAGNOSIS — Z419 Encounter for procedure for purposes other than remedying health state, unspecified: Secondary | ICD-10-CM | POA: Diagnosis not present

## 2020-07-06 DIAGNOSIS — Z419 Encounter for procedure for purposes other than remedying health state, unspecified: Secondary | ICD-10-CM | POA: Diagnosis not present

## 2020-07-07 DIAGNOSIS — Z20822 Contact with and (suspected) exposure to covid-19: Secondary | ICD-10-CM | POA: Diagnosis not present

## 2020-07-20 ENCOUNTER — Other Ambulatory Visit: Payer: Self-pay | Admitting: Obstetrics & Gynecology

## 2020-07-20 DIAGNOSIS — Z3041 Encounter for surveillance of contraceptive pills: Secondary | ICD-10-CM

## 2020-08-06 DIAGNOSIS — Z419 Encounter for procedure for purposes other than remedying health state, unspecified: Secondary | ICD-10-CM | POA: Diagnosis not present

## 2020-09-06 DIAGNOSIS — Z419 Encounter for procedure for purposes other than remedying health state, unspecified: Secondary | ICD-10-CM | POA: Diagnosis not present

## 2020-10-06 DIAGNOSIS — Z419 Encounter for procedure for purposes other than remedying health state, unspecified: Secondary | ICD-10-CM | POA: Diagnosis not present

## 2020-10-11 ENCOUNTER — Emergency Department: Payer: Medicaid Other

## 2020-10-11 ENCOUNTER — Encounter: Payer: Self-pay | Admitting: Emergency Medicine

## 2020-10-11 ENCOUNTER — Other Ambulatory Visit: Payer: Self-pay

## 2020-10-11 DIAGNOSIS — R0789 Other chest pain: Secondary | ICD-10-CM | POA: Insufficient documentation

## 2020-10-11 DIAGNOSIS — R079 Chest pain, unspecified: Secondary | ICD-10-CM | POA: Diagnosis not present

## 2020-10-11 DIAGNOSIS — R002 Palpitations: Secondary | ICD-10-CM | POA: Insufficient documentation

## 2020-10-11 DIAGNOSIS — R0602 Shortness of breath: Secondary | ICD-10-CM | POA: Diagnosis not present

## 2020-10-11 LAB — CBC
HCT: 42.5 % (ref 36.0–46.0)
Hemoglobin: 14.6 g/dL (ref 12.0–15.0)
MCH: 29.8 pg (ref 26.0–34.0)
MCHC: 34.4 g/dL (ref 30.0–36.0)
MCV: 86.7 fL (ref 80.0–100.0)
Platelets: 200 10*3/uL (ref 150–400)
RBC: 4.9 MIL/uL (ref 3.87–5.11)
RDW: 12 % (ref 11.5–15.5)
WBC: 6 10*3/uL (ref 4.0–10.5)
nRBC: 0 % (ref 0.0–0.2)

## 2020-10-11 LAB — BASIC METABOLIC PANEL
Anion gap: 8 (ref 5–15)
BUN: 19 mg/dL (ref 6–20)
CO2: 27 mmol/L (ref 22–32)
Calcium: 9.3 mg/dL (ref 8.9–10.3)
Chloride: 103 mmol/L (ref 98–111)
Creatinine, Ser: 0.66 mg/dL (ref 0.44–1.00)
GFR, Estimated: >60 mL/min (ref >60)
Glucose, Bld: 102 mg/dL — ABNORMAL HIGH (ref 70–99)
Potassium: 3.8 mmol/L (ref 3.5–5.1)
Sodium: 138 mmol/L (ref 135–145)

## 2020-10-11 LAB — TROPONIN I (HIGH SENSITIVITY)
Troponin I (High Sensitivity): <2 ng/L (ref <18)
Troponin I (High Sensitivity): <2 ng/L (ref <18)

## 2020-10-11 LAB — POC URINE PREG, ED: Preg Test, Ur: NEGATIVE

## 2020-10-11 NOTE — ED Triage Notes (Signed)
Pt to ED from home c/o left chest pain for months that has became an everyday thing for the last couple weeks of intermittent twisting pain and palpitations.  Denies drugs, alcohol, excessive caffeine or stress.  States feels SOB with episodes, denies n/v/d, and states notices pain most at night or lying in bed.  Pt A&Ox4, chest rise even and unlabored, in NAD at this time.

## 2020-10-12 ENCOUNTER — Emergency Department
Admission: EM | Admit: 2020-10-12 | Discharge: 2020-10-12 | Disposition: A | Discharge status: Home / Self Care | Payer: Medicaid Other | Attending: Emergency Medicine | Admitting: Emergency Medicine

## 2020-10-12 DIAGNOSIS — R0789 Other chest pain: Secondary | ICD-10-CM | POA: Diagnosis not present

## 2020-10-12 DIAGNOSIS — R002 Palpitations: Secondary | ICD-10-CM

## 2020-10-12 MED ORDER — IBUPROFEN 800 MG PO TABS: 800.0000 mg | ORAL_TABLET | Freq: Three times a day (TID) | ORAL | 0 refills | Status: DC | PRN

## 2020-10-12 MED ORDER — IBUPROFEN 800 MG PO TABS
800.0000 mg | ORAL_TABLET | Freq: Once | ORAL | Status: AC
  Administered 2020-10-12: 800 mg via ORAL
  Filled 2020-10-12: qty 1

## 2020-10-12 NOTE — ED Provider Notes (Signed)
[facility] Emergency Department Provider Note  ____________________________________________   Event Date/Time   First MD Initiated Contact with Patient 10/12/20 0028     (approximate)  I have reviewed the triage vital signs and the nursing notes.   HISTORY  Chief Complaint Chest Pain    HPI Kelly Harrell is a 20 y.o. female with no significant past medical history who presents to the emergency department with complaints of intermittent left-sided chest tightness for the past year.  States pain has now been more constant for the past 2 weeks and is worse with palpation of the chest and deep inspiration.  She reports at times she feels like her left arm is heavy and numb.  She has had associated shortness of breath.  Pain is not exertional.  She states it is worse at night when she is trying to go to sleep and she will have palpitations where she feels like her heart is racing.  She denies fevers, cough, nausea, vomiting, diaphoresis, dizziness.  No family history of premature CAD.  No history of PE, DVT, exogenous estrogen use, recent fractures, surgery, trauma, hospitalization, prolonged travel or other immobilization. No lower extremity swelling or pain. No calf tenderness.  States symptoms are worse when she drinks caffeine so she has cut this out completely.  States her mother has given her acid reflux medication which helps somewhat.  She has not tried Tylenol, ibuprofen.  She does not have a doctor for outpatient follow-up.  States she has never seen anyone for this pain.        Past Medical History:  Diagnosis Date   Ovarian cyst     Patient Active Problem List   Diagnosis Date Noted   Encounter for surveillance of contraceptive pills 05/18/2017   Hemorrhagic ovarian cyst 10/26/2016   Abdominal pain, chronic, right lower quadrant 10/26/2016    Past Surgical History:  Procedure Laterality Date   LAPAROSCOPY N/A 10/26/2016   Procedure:  LAPAROSCOPY DIAGNOSTIC;  Surgeon: Nadara Mustard, MD;  Location: [facility] ORS;  Service: Gynecology;  Laterality: N/A;    Prior to Admission medications   Medication Sig Start Date End Date Taking? Authorizing Provider  ibuprofen (ADVIL) 800 MG tablet Take 1 tablet (800 mg total) by mouth every 8 (eight) hours as needed for mild pain. 10/12/20  Yes Shalissa Easterwood, Layla Maw, DO  drospirenone-ethinyl estradiol (YAZ) 3-0.02 MG tablet TAKE 1 TABLET BY MOUTH DAILY 07/20/20 10/18/20  Nadara Mustard, MD    Allergies Patient has no known allergies.  Family History  Problem Relation Age of Onset   Hypertension Mother    Diabetes Mother     Social History Social History   Tobacco Use   Smoking status: Never   Smokeless tobacco: Never  Vaping Use   Vaping Use: Never used  Substance Use Topics   Alcohol use: No   Drug use: No    Review of Systems Constitutional: No fever. Eyes: No visual changes. ENT: No sore throat. Cardiovascular: + chest pain. Respiratory: + shortness of breath. Gastrointestinal: No nausea, vomiting, diarrhea. Genitourinary: Negative for dysuria. Musculoskeletal: Negative for back pain. Skin: Negative for rash. Neurological: Negative for focal weakness or numbness.  ____________________________________________   PHYSICAL EXAM:  VITAL SIGNS: ED Triage Vitals  Enc Vitals Group     BP 10/12/20 0000 108/79     Pulse Rate 10/12/20 0000 76     Resp 10/12/20 0000 20     Temp 10/12/20 0000 97.9 F (36.6 C)  Temp Source 10/12/20 0000 Oral     SpO2 10/12/20 0000 99 %     Weight 10/11/20 2026 110 lb (49.9 kg)     Height 10/11/20 2026 5\' 4"  (1.626 m)     Head Circumference --      Peak Flow --      Pain Score 10/11/20 2025 7     Pain Loc --      Pain Edu? --      Excl. in GC? --    CONSTITUTIONAL: Alert and oriented and responds appropriately to questions. Well-appearing; well-nourished HEAD: Normocephalic EYES: Conjunctivae clear, pupils appear equal, EOM  appear intact ENT: normal nose; moist mucous membranes NECK: Supple, normal ROM CARD: RRR; S1 and S2 appreciated; no murmurs, no clicks, no rubs, no gallops; tender to palpation over the left chest wall which reproduces her pain without crepitus, deformity RESP: Normal chest excursion without splinting or tachypnea; breath sounds clear and equal bilaterally; no wheezes, no rhonchi, no rales, no hypoxia or respiratory distress, speaking full sentences ABD/GI: Normal bowel sounds; non-distended; soft, non-tender, no rebound, no guarding, no peritoneal signs, no hepatosplenomegaly BACK: The back appears normal EXT: Normal ROM in all joints; no deformity noted, no edema; no cyanosis, no calf tenderness or calf swelling SKIN: Normal color for age and race; warm; no rash on exposed skin NEURO: Moves all extremities equally PSYCH: The patient's mood and manner are appropriate.  ____________________________________________   LABS (all labs ordered are listed, but only abnormal results are displayed)  Labs Reviewed  BASIC METABOLIC PANEL - Abnormal; Notable for the following components:      Result Value   Glucose, Bld 102 (*)    All other components within normal limits  CBC  POC URINE PREG, ED  TROPONIN I (HIGH SENSITIVITY)  TROPONIN I (HIGH SENSITIVITY)   ____________________________________________  EKG   EKG Interpretation  Date/Time:  Thursday October 11 2020 20:31:52 EDT Ventricular Rate:  100 PR Interval:  144 QRS Duration: 68 QT Interval:  348 QTC Calculation: 448 R Axis:   88 Text Interpretation: Normal sinus rhythm Nonspecific T wave abnormality Abnormal ECG Confirmed by 1974 [phone]) on 10/12/2020 12:29:38 AM        ____________________________________________  RADIOLOGY 12/12/2020 Narek Kniss, personally viewed and evaluated these images (plain radiographs) as part of my medical decision making, as well as reviewing the written report by the radiologist.  ED MD  interpretation: Chest x-ray clear.  Official radiology report(s): DG Chest 2 View  Result Date: 10/11/2020 CLINICAL DATA:  Chest pain EXAM: CHEST - 2 VIEW COMPARISON:  12/12/2017 FINDINGS: The heart size and mediastinal contours are within normal limits. Both lungs are clear. The visualized skeletal structures are unremarkable. IMPRESSION: No active cardiopulmonary disease. Electronically Signed   By: 14/07/2017 M.D.   On: 10/11/2020 20:47    ____________________________________________   PROCEDURES  Procedure(s) performed (including Critical Care):  Procedures   ____________________________________________   INITIAL IMPRESSION / ASSESSMENT AND PLAN / ED COURSE  As part of my medical decision making, I reviewed the following data within the electronic MEDICAL RECORD NUMBER Nursing notes reviewed and incorporated, Labs reviewed , EKG interpreted , Old EKG reviewed, Old chart reviewed, Radiograph reviewed , and Notes from prior ED visits         Patient here with atypical chest pain.  Seems to be musculoskeletal in nature.  She is PERC negative.  Doubt ACS.  Troponin x2 obtained from triage is negative.  Doubt dissection.  Chest x-ray clear showing no infiltrate, edema, pneumothorax, rib fracture.  No cardiomegaly.  No widened mediastinum.  Recommended Tylenol, Motrin at home for pain.  She does report intermittent palpitations and have recommended that she follow-up with cardiology as she may benefit from a Zio patch/Holter monitoring.  She states symptoms are worse with drinking caffeine.  Recommended that she avoid any stimulants at this time.  I feel she is safe to be discharged home.  Patient and mother at bedside are comfortable with this plan.  Given outpatient PCP and cardiology follow-up information.   At this time, I do not feel there is any life-threatening condition present. I have reviewed, interpreted and discussed all results (EKG, imaging, lab, urine as appropriate) and  exam findings with patient/family. I have reviewed nursing notes and appropriate previous records.  I feel the patient is safe to be discharged home without further emergent workup and can continue workup as an outpatient as needed. Discussed usual and customary return precautions. Patient/family verbalize understanding and are comfortable with this plan.  Outpatient follow-up has been provided as needed. All questions have been answered.  ____________________________________________   FINAL CLINICAL IMPRESSION(S) / ED DIAGNOSES  Final diagnoses:  Chest wall pain  Palpitations     ED Discharge Orders          Ordered    ibuprofen (ADVIL) 800 MG tablet  Every 8 hours PRN        10/12/20 0042            *Please note:  Emberlie T Lagrange was evaluated in Emergency Department on 10/12/2020 for the symptoms described in the history of present illness. She was evaluated in the context of the global COVID-19 pandemic, which necessitated consideration that the patient might be at risk for infection with the SARS-CoV-2 virus that causes COVID-19. Institutional protocols and algorithms that pertain to the evaluation of patients at risk for COVID-19 are in a state of rapid change based on information released by regulatory bodies including the CDC and federal and state organizations. These policies and algorithms were followed during the patient's care in the ED.  Some ED evaluations and interventions may be delayed as a result of limited staffing during and the pandemic.*   Note:  This document was prepared using Dragon voice recognition software and may include unintentional dictation errors.    Samaj Wessells, Layla Maw, DO 10/12/20 [phone]

## 2020-10-12 NOTE — Discharge Instructions (Signed)
You may alternate Tylenol 1000 mg every 6 hours as needed for pain, fever and Ibuprofen 800 mg every 8 hours as needed for pain, fever.  Please take Ibuprofen with food.  Do not take more than 4000 mg of Tylenol (acetaminophen) in a 24 hour period.   Steps to find a Primary Care Provider (PCP):  Call [phone] or [phone] to access "[place] Find a Doctor Service."  2.  You may also go on the  website at [email or web address]  

## 2020-11-06 DIAGNOSIS — Z419 Encounter for procedure for purposes other than remedying health state, unspecified: Secondary | ICD-10-CM | POA: Diagnosis not present

## 2020-11-24 ENCOUNTER — Encounter: Payer: Self-pay | Admitting: Internal Medicine

## 2020-11-24 ENCOUNTER — Other Ambulatory Visit: Payer: Self-pay

## 2020-11-24 ENCOUNTER — Ambulatory Visit: Payer: Medicaid Other | Admitting: Internal Medicine

## 2020-11-24 VITALS — BP 91/67 | HR 85 | Temp 97.5°F | Ht 64.5 in | Wt 117.4 lb

## 2020-11-24 DIAGNOSIS — G43009 Migraine without aura, not intractable, without status migrainosus: Secondary | ICD-10-CM

## 2020-11-24 DIAGNOSIS — R0789 Other chest pain: Secondary | ICD-10-CM

## 2020-11-24 MED ORDER — SUMATRIPTAN SUCCINATE 50 MG PO TABS: 50.0000 mg | ORAL_TABLET | ORAL | 0 refills | Status: DC | PRN

## 2020-11-24 MED ORDER — CELECOXIB 200 MG PO CAPS: 200.0000 mg | ORAL_CAPSULE | Freq: Two times a day (BID) | ORAL | 2 refills | Status: DC

## 2020-11-24 NOTE — Progress Notes (Signed)
New Patient Office Visit  Subjective:  Patient ID: Kelly Harrell, female    DOB: 08-10-2000  Age: 20 y.o. MRN: [id number]  CC:  Chief Complaint  Patient presents with   Chest Pain   Migraine    HPI Kelly Harrell presents for C/o migraine x 4 yrs without aura, associated with nausea and photophobia. Unrelieved with otc medications including Excedrin migraine, otc tylenol or nsaids. Headaches now occurring daily, start around midday and relieved by rest. Also c/o left chest pain unrelated by exertion exacerbated by caffeine consumption, lasts for a few minutes and resolves spontaneously without any medications, also exacerbated by menstruation and lying on her left side.  Past Medical History:  Diagnosis Date   Ovarian cyst     Past Surgical History:  Procedure Laterality Date   LAPAROSCOPY N/A 10/26/2016   Procedure: LAPAROSCOPY DIAGNOSTIC;  Surgeon: Nadara Mustard, MD;  Location: [facility] ORS;  Service: Gynecology;  Laterality: N/A;    Family History  Problem Relation Age of Onset   Hypertension Mother    Diabetes Mother     Social History   Socioeconomic History   Marital status: Single    Spouse name: Not on file   Number of children: Not on file   Years of education: Not on file   Highest education level: Not on file  Occupational History   Not on file  Tobacco Use   Smoking status: Never   Smokeless tobacco: Never  Vaping Use   Vaping Use: Never used  Substance and Sexual Activity   Alcohol use: No   Drug use: No   Sexual activity: Not on file  Other Topics Concern   Not on file  Social History Narrative   Not on file   Social Determinants of Health   Financial Resource Strain: Not on file  Food Insecurity: Not on file  Transportation Needs: Not on file  Physical Activity: Not on file  Stress: Not on file  Social Connections: Not on file  Intimate Partner Violence: Not on file    ROS Review of Systems  Constitutional: Negative.   HENT:  Negative.    Eyes: Negative.   Respiratory: Negative.    Cardiovascular: Negative.   Gastrointestinal: Negative.   Endocrine: Negative.   Genitourinary: Negative.    Objective:   Today'Devontae Casasola Vitals: BP 91/67 (BP Location: Left Arm, Patient Position: Sitting, Cuff Size: Normal)   Pulse 85   Temp (!) 97.5 F (36.4 C)   Ht 5' 4.5" (1.638 m)   Wt 117 lb 6.4 oz (53.3 kg)   SpO2 98%   BMI 19.84 kg/m   Physical Exam Constitutional:      Appearance: She is obese.  HENT:     Head: Normocephalic.  Eyes:     Pupils: Pupils are equal, round, and reactive to light.  Cardiovascular:     Rate and Rhythm: Normal rate and regular rhythm.     Pulses: Normal pulses.     Heart sounds: Normal heart sounds. No murmur heard.    Comments: Chest well tenderness central sternum Pulmonary:     Effort: Pulmonary effort is normal.  Musculoskeletal:     Cervical back: Normal range of motion.  Skin:    General: Skin is warm.  Neurological:     General: No focal deficit present.     Mental Status: She is alert.     Cranial Nerves: No cranial nerve deficit.     Motor: No weakness.  Psychiatric:  Mood and Affect: Mood normal.    Assessment & Plan:   Problem List Items Addressed This Visit   None   Outpatient Encounter Medications as of 11/24/2020  Medication Sig   drospirenone-ethinyl estradiol (YAZ) 3-0.02 MG tablet TAKE 1 TABLET BY MOUTH DAILY   ibuprofen (ADVIL) 800 MG tablet Take 1 tablet (800 mg total) by mouth every 8 (eight) hours as needed for mild pain.   No facility-administered encounter medications on file as of 11/24/2020.    Follow-up: No follow-ups on file.   Luna Fuse, MD

## 2020-12-06 ENCOUNTER — Other Ambulatory Visit: Payer: Self-pay | Admitting: Internal Medicine

## 2020-12-06 DIAGNOSIS — Z419 Encounter for procedure for purposes other than remedying health state, unspecified: Secondary | ICD-10-CM | POA: Diagnosis not present

## 2020-12-07 LAB — CBC WITH DIFFERENTIAL/PLATELET
Basophils Absolute: 0.1 10*3/uL (ref 0.0–0.2)
Basos: 1 %
EOS (ABSOLUTE): 0.1 10*3/uL (ref 0.0–0.4)
Eos: 2 %
Hematocrit: 43.7 % (ref 34.0–46.6)
Hemoglobin: 14.7 g/dL (ref 11.1–15.9)
Immature Grans (Abs): 0 10*3/uL (ref 0.0–0.1)
Immature Granulocytes: 0 %
Lymphocytes Absolute: 1.5 10*3/uL (ref 0.7–3.1)
Lymphs: 28 %
MCH: 29.1 pg (ref 26.6–33.0)
MCHC: 33.6 g/dL (ref 31.5–35.7)
MCV: 87 fL (ref 79–97)
Monocytes Absolute: 0.4 10*3/uL (ref 0.1–0.9)
Monocytes: 8 %
Neutrophils Absolute: 3.2 10*3/uL (ref 1.4–7.0)
Neutrophils: 61 %
Platelets: 206 10*3/uL (ref 150–450)
RBC: 5.05 x10E6/uL (ref 3.77–5.28)
RDW: 12 % (ref 11.7–15.4)
WBC: 5.2 10*3/uL (ref 3.4–10.8)

## 2020-12-07 LAB — COMPREHENSIVE METABOLIC PANEL
ALT: 9 IU/L (ref 0–32)
AST: 14 IU/L (ref 0–40)
Albumin/Globulin Ratio: 2.9 — ABNORMAL HIGH (ref 1.2–2.2)
Albumin: 5.2 g/dL — ABNORMAL HIGH (ref 3.9–5.0)
Alkaline Phosphatase: 46 IU/L (ref 44–121)
BUN/Creatinine Ratio: 24 — ABNORMAL HIGH (ref 9–23)
BUN: 13 mg/dL (ref 6–20)
Bilirubin Total: 0.8 mg/dL (ref 0.0–1.2)
CO2: 19 mmol/L — ABNORMAL LOW (ref 20–29)
Calcium: 9.1 mg/dL (ref 8.7–10.2)
Chloride: 103 mmol/L (ref 96–106)
Creatinine, Ser: 0.55 mg/dL — ABNORMAL LOW (ref 0.57–1.00)
Globulin, Total: 1.8 g/dL (ref 1.5–4.5)
Glucose: 73 mg/dL (ref 70–99)
Potassium: 4.2 mmol/L (ref 3.5–5.2)
Sodium: 138 mmol/L (ref 134–144)
Total Protein: 7 g/dL (ref 6.0–8.5)
eGFR: 133 mL/min/{1.73_m2} (ref >59)

## 2020-12-07 LAB — TSH: TSH: 1.5 u[IU]/mL (ref 0.450–4.500)

## 2020-12-08 ENCOUNTER — Other Ambulatory Visit: Payer: Self-pay

## 2020-12-08 ENCOUNTER — Ambulatory Visit: Payer: Medicaid Other | Admitting: Internal Medicine

## 2020-12-08 ENCOUNTER — Encounter: Payer: Self-pay | Admitting: Internal Medicine

## 2020-12-08 VITALS — BP 100/72 | HR 98 | Temp 98.8°F | Ht 64.37 in | Wt 116.4 lb

## 2020-12-08 DIAGNOSIS — G43009 Migraine without aura, not intractable, without status migrainosus: Secondary | ICD-10-CM

## 2020-12-08 DIAGNOSIS — M940 Chondrocostal junction syndrome [Tietze]: Secondary | ICD-10-CM

## 2020-12-08 NOTE — Progress Notes (Signed)
Internal MEDICINE  Office Visit Note  Patient Name: Kelly Harrell  [id number]  [id number]  Date of Service: 12/08/2020  Chief Complaint  Patient presents with   Follow-up    HPI  Pt is here for routine follow up.  Migraine HA Lab f/u Labs all normal Imitrex - caused reaction- could not tolerate.  Current Medication: Outpatient Encounter Medications as of 12/08/2020  Medication Sig   celecoxib (CELEBREX) 200 MG capsule Take 1 capsule (200 mg total) by mouth 2 (two) times daily.   drospirenone-ethinyl estradiol (YAZ) 3-0.02 MG tablet TAKE 1 TABLET BY MOUTH DAILY   ibuprofen (ADVIL) 800 MG tablet Take 1 tablet (800 mg total) by mouth every 8 (eight) hours as needed for mild pain.   SUMAtriptan (IMITREX) 50 MG tablet Take 1 tablet (50 mg total) by mouth every 2 (two) hours as needed for migraine. May repeat in 2 hours if headache persists or recurs.   No facility-administered encounter medications on file as of 12/08/2020.    Surgical History: Past Surgical History:  Procedure Laterality Date   LAPAROSCOPY N/A 10/26/2016   Procedure: LAPAROSCOPY DIAGNOSTIC;  Surgeon: Nadara Mustard, MD;  Location: [facility] ORS;  Service: Gynecology;  Laterality: N/A;    Medical History: Past Medical History:  Diagnosis Date   Ovarian cyst     Family History: Family History  Problem Relation Age of Onset   Hypertension Mother    Diabetes Mother     Social History   Socioeconomic History   Marital status: Single    Spouse name: Not on file   Number of children: Not on file   Years of education: Not on file   Highest education level: Not on file  Occupational History   Not on file  Tobacco Use   Smoking status: Never   Smokeless tobacco: Never  Vaping Use   Vaping Use: Never used  Substance and Sexual Activity   Alcohol use: No   Drug use: No   Sexual activity: Not on file  Other Topics Concern   Not on file  Social History Narrative   Not on file   Social  Determinants of Health   Financial Resource Strain: Not on file  Food Insecurity: Not on file  Transportation Needs: Not on file  Physical Activity: Not on file  Stress: Not on file  Social Connections: Not on file  Intimate Partner Violence: Not on file      Review of Systems  Vital Signs: BP 100/72 (BP Location: Left Arm, Patient Position: Sitting, Cuff Size: Normal)   Pulse 98   Temp 98.8 F (37.1 C) (Temporal)   Ht 5' 4.37" (1.635 m)   Wt 116 lb 6.4 oz (52.8 kg)   SpO2 99%   BMI 19.75 kg/m    Physical Exam     Assessment/Plan: Migraine HA-  take exedrin migriane OTC ( reation to Imitrex) Chest wall pain- costocondritis-    celebrex- no benefit- told to take aleve OTC bid Labs are normal- reviewed with pt today-  Lab from 12/06/20  General Counseling: Shawnna verbalizes understanding of the findings of todays visit and agrees with plan of treatment. I have discussed any further diagnostic evaluation that may be needed or ordered today. We also reviewed her medications today. she has been encouraged to call the office with any questions or concerns that should arise related to todays visit.    No orders of the defined types were placed in this encounter.   No orders  of the defined types were placed in this encounter.       Georgann Housekeeper

## 2021-01-06 DIAGNOSIS — Z419 Encounter for procedure for purposes other than remedying health state, unspecified: Secondary | ICD-10-CM | POA: Diagnosis not present

## 2021-02-06 DIAGNOSIS — Z419 Encounter for procedure for purposes other than remedying health state, unspecified: Secondary | ICD-10-CM | POA: Diagnosis not present

## 2021-03-06 DIAGNOSIS — Z419 Encounter for procedure for purposes other than remedying health state, unspecified: Secondary | ICD-10-CM | POA: Diagnosis not present

## 2021-04-06 DIAGNOSIS — Z419 Encounter for procedure for purposes other than remedying health state, unspecified: Secondary | ICD-10-CM | POA: Diagnosis not present

## 2021-04-30 ENCOUNTER — Ambulatory Visit
Admission: EM | Admit: 2021-04-30 | Discharge: 2021-04-30 | Disposition: A | Discharge status: Home / Self Care | Payer: Medicaid Other | Attending: Physician Assistant | Admitting: Physician Assistant

## 2021-04-30 ENCOUNTER — Other Ambulatory Visit: Payer: Self-pay

## 2021-04-30 ENCOUNTER — Encounter: Payer: Self-pay | Admitting: Emergency Medicine

## 2021-04-30 DIAGNOSIS — J020 Streptococcal pharyngitis: Secondary | ICD-10-CM | POA: Diagnosis not present

## 2021-04-30 LAB — GROUP A STREP BY PCR: Group A Strep by PCR: DETECTED — AB

## 2021-04-30 MED ORDER — AMOXICILLIN 500 MG PO CAPS: 500.0000 mg | ORAL_CAPSULE | Freq: Two times a day (BID) | ORAL | 0 refills | Status: AC

## 2021-04-30 NOTE — ED Triage Notes (Signed)
Pt c/o sore throat. Started this morning. She states the radiates into her ear. Denies fever, headaches.  ?

## 2021-04-30 NOTE — Discharge Instructions (Addendum)
-  Your strep test was positive.  Have sent antibiotics to pharmacy so you should be feeling better in the next couple days but take the full course. ?- Can take ibuprofen and Tylenol and use Chloraseptic spray, throat lozenges for the pain. ?- Increase rest and fluid intake. ?- Follow-up if no improvement in the next several days.  Go to ER if any worsening symptoms. ?- You need to be out of work at least 24 hours but I have given you note for couple days in case you need it. ?

## 2021-04-30 NOTE — ED Provider Notes (Signed)
?[facility] ? ? ? ?CSN: [id number] ?Arrival date & time: 04/30/21  1837 ? ? ?  ? ?History   ?Chief Complaint ?Chief Complaint  ?Patient presents with  ? Sore Throat  ? ? ?HPI ?Kelly Harrell is a 21 y.o. female presenting for sore throat, swollen lymph node of the right side of her neck and right ear pain that began this morning.  Denies fever, fatigue, aches, cough or congestion.  Breathing normally.  No nausea/vomiting or diarrhea reported.  Has taken Tylenol which she says did not help.  Denies any sick contacts.  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  Reports that she always wears her mask though.  No other complaints. ? ?HPI ? ?Past Medical History:  ?Diagnosis Date  ? Ovarian cyst   ? ? ?Patient Active Problem List  ? Diagnosis Date Noted  ? Encounter for surveillance of contraceptive pills 05/18/2017  ? Hemorrhagic ovarian cyst 10/26/2016  ? Abdominal pain, chronic, right lower quadrant 10/26/2016  ? ? ?Past Surgical History:  ?Procedure Laterality Date  ? LAPAROSCOPY N/A 10/26/2016  ? Procedure: LAPAROSCOPY DIAGNOSTIC;  Surgeon: Nadara Mustard, MD;  Location: [facility];  Service: Gynecology;  Laterality: N/A;  ? ? ?OB History   ?No obstetric history on file. ?  ? ? ? ?Home Medications   ? ?Prior to Admission medications   ?Medication Sig Start Date End Date Taking? Authorizing Provider  ?amoxicillin (AMOXIL) 500 MG capsule Take 1 capsule (500 mg total) by mouth 2 (two) times daily for 10 days. 04/30/21 05/10/21 Yes Eusebio Friendly B, PA-C  ?celecoxib (CELEBREX) 200 MG capsule Take 1 capsule (200 mg total) by mouth 2 (two) times daily. 11/24/20   Sherron Monday, MD  ?drospirenone-ethinyl estradiol (YAZ) 3-0.02 MG tablet TAKE 1 TABLET BY MOUTH DAILY 07/20/20 10/18/20  Nadara Mustard, MD  ?ibuprofen (ADVIL) 800 MG tablet Take 1 tablet (800 mg total) by mouth every 8 (eight) hours as needed for mild pain. 10/12/20   Ward, Layla Maw, DO  ?SUMAtriptan (IMITREX) 50 MG tablet Take 1 tablet (50  mg total) by mouth every 2 (two) hours as needed for migraine. May repeat in 2 hours if headache persists or recurs. 11/24/20   Sherron Monday, MD  ? ? ?Family History ?Family History  ?Problem Relation Age of Onset  ? Hypertension Mother   ? Diabetes Mother   ? ? ?Social History ?Social History  ? ?Tobacco Use  ? Smoking status: Never  ? Smokeless tobacco: Never  ?Vaping Use  ? Vaping Use: Never used  ?Substance Use Topics  ? Alcohol use: No  ? Drug use: No  ? ? ? ?Allergies   ?Patient has no known allergies. ? ? ?Review of Systems ?Review of Systems  ?Constitutional:  Negative for chills, diaphoresis, fatigue and fever.  ?HENT:  Positive for ear pain and sore throat. Negative for congestion and rhinorrhea.   ?Respiratory:  Negative for cough and shortness of breath.   ?Cardiovascular:  Negative for chest pain.  ?Gastrointestinal:  Negative for abdominal pain, nausea and vomiting.  ?Musculoskeletal:  Negative for arthralgias and myalgias.  ?Skin:  Negative for rash.  ?Neurological:  Negative for weakness and headaches.  ?Hematological:  Positive for adenopathy.  ? ? ?Physical Exam ?Triage Vital Signs ?ED Triage Vitals  ?Enc Vitals Group  ?   BP   ?   Pulse   ?   Resp   ?   Temp   ?  Temp src   ?   SpO2   ?   Weight   ?   Height   ?   Head Circumference   ?   Peak Flow   ?   Pain Score   ?   Pain Loc   ?   Pain Edu?   ?   Excl. in GC?   ? ?No data found. ? ?Updated Vital Signs ?BP 114/78 (BP Location: Right Arm)   Pulse 78   Temp 98.2 ?F (36.8 ?C) (Oral)   Resp 16   Ht 5' 4.37" (1.635 m)   Wt 116 lb 6.5 oz (52.8 kg)   LMP 04/09/2021 (Approximate)   SpO2 99%   BMI 19.75 kg/m?  ? ?Physical Exam ?Vitals and nursing note reviewed.  ?Constitutional:   ?   General: She is not in acute distress. ?   Appearance: Normal appearance. She is not ill-appearing or toxic-appearing.  ?HENT:  ?   Head: Normocephalic and atraumatic.  ?   Right Ear: Tympanic membrane, ear canal and external ear normal.  ?   Left Ear:  Tympanic membrane, ear canal and external ear normal.  ?   Nose: Nose normal.  ?   Mouth/Throat:  ?   Mouth: Mucous membranes are moist.  ?   Pharynx: Oropharynx is clear. Posterior oropharyngeal erythema present.  ?   Tonsils: 1+ on the right.  ?Eyes:  ?   General: No scleral icterus.    ?   Right eye: No discharge.     ?   Left eye: No discharge.  ?   Conjunctiva/sclera: Conjunctivae normal.  ?Cardiovascular:  ?   Rate and Rhythm: Normal rate and regular rhythm.  ?   Heart sounds: Normal heart sounds.  ?Pulmonary:  ?   Effort: Pulmonary effort is normal. No respiratory distress.  ?   Breath sounds: Normal breath sounds.  ?Musculoskeletal:  ?   Cervical back: Neck supple.  ?Lymphadenopathy:  ?   Cervical: Cervical adenopathy (right anterior cervical) present.  ?Skin: ?   General: Skin is dry.  ?Neurological:  ?   General: No focal deficit present.  ?   Mental Status: She is alert. Mental status is at baseline.  ?   Motor: No weakness.  ?   Gait: Gait normal.  ?Psychiatric:     ?   Mood and Affect: Mood normal.     ?   Behavior: Behavior normal.     ?   Thought Content: Thought content normal.  ? ? ? ?UC Treatments / Results  ?Labs ?(all labs ordered are listed, but only abnormal results are displayed) ?Labs Reviewed  ?GROUP A STREP BY PCR - Abnormal; Notable for the following components:  ?    Result Value  ? Group A Strep by PCR DETECTED (*)   ? All other components within normal limits  ? ? ?EKG ? ? ?Radiology ?No results found. ? ?Procedures ?Procedures (including critical care time) ? ?Medications Ordered in UC ?Medications - No data to display ? ?Initial Impression / Assessment and Plan / UC Course  ?I have reviewed the triage vital signs and the nursing notes. ? ?Pertinent labs & imaging results that were available during my care of the patient were reviewed by me and considered in my medical decision making (see chart for details). ? ?21 year old female presenting for sore throat, swollen lymph node of  right-sided neck and right-sided ear pain that began this morning.  No associated fever, cough  or congestion.  Vitals normal and stable and patient overall well-appearing.  On exam she has erythema posterior pharynx with 1+ enlarged tonsil on the right side and enlarged right anterior cervical lymph node.  The remainder of the HEENT exam is normal.  Chest clear to auscultation heart regular rate and rhythm.  PCR strep test obtained.  Positive strep. Discussed results strep test with patient.  Sent amoxicillin.  Supportive care advised with increasing rest and fluids.  Advised to continue over-the-counter Tylenol and/or Motrin as needed for discomfort but should be improving in the next several days.  Follow-up as needed.  ED precautions reviewed.  Work note provided. ? ? ?Final Clinical Impressions(s) / UC Diagnoses  ? ?Final diagnoses:  ?Strep throat  ? ? ? ?Discharge Instructions   ? ?  ?-Your strep test was positive.  Have sent antibiotics to pharmacy so you should be feeling better in the next couple days but take the full course. ?- Can take ibuprofen and Tylenol and use Chloraseptic spray, throat lozenges for the pain. ?- Increase rest and fluid intake. ?- Follow-up if no improvement in the next several days.  Go to ER if any worsening symptoms. ?- You need to be out of work at least 24 hours but I have given you note for couple days in case you need it. ? ? ? ? ?ED Prescriptions   ? ? Medication Sig Dispense Auth. Provider  ? amoxicillin (AMOXIL) 500 MG capsule Take 1 capsule (500 mg total) by mouth 2 (two) times daily for 10 days. 20 capsule Shirlee LatchEaves, Anitria Andon B, PA-C  ? ?  ? ?PDMP not reviewed this encounter. ?  ?Shirlee Latchaves, Viveka Wilmeth B, PA-C ?04/30/21 1935 ? ?

## 2021-05-06 DIAGNOSIS — Z419 Encounter for procedure for purposes other than remedying health state, unspecified: Secondary | ICD-10-CM | POA: Diagnosis not present

## 2021-06-05 DIAGNOSIS — R21 Rash and other nonspecific skin eruption: Secondary | ICD-10-CM | POA: Diagnosis not present

## 2021-06-06 DIAGNOSIS — Z419 Encounter for procedure for purposes other than remedying health state, unspecified: Secondary | ICD-10-CM | POA: Diagnosis not present

## 2021-07-06 DIAGNOSIS — Z419 Encounter for procedure for purposes other than remedying health state, unspecified: Secondary | ICD-10-CM | POA: Diagnosis not present

## 2021-08-06 DIAGNOSIS — Z419 Encounter for procedure for purposes other than remedying health state, unspecified: Secondary | ICD-10-CM | POA: Diagnosis not present

## 2021-09-06 DIAGNOSIS — Z419 Encounter for procedure for purposes other than remedying health state, unspecified: Secondary | ICD-10-CM | POA: Diagnosis not present

## 2021-10-05 ENCOUNTER — Encounter: Payer: Self-pay | Admitting: Emergency Medicine

## 2021-10-05 ENCOUNTER — Ambulatory Visit
Admission: EM | Admit: 2021-10-05 | Discharge: 2021-10-05 | Disposition: A | Discharge status: Home / Self Care | Payer: Medicaid Other | Attending: Physician Assistant | Admitting: Physician Assistant

## 2021-10-05 DIAGNOSIS — J45909 Unspecified asthma, uncomplicated: Secondary | ICD-10-CM

## 2021-10-05 DIAGNOSIS — Z1152 Encounter for screening for COVID-19: Secondary | ICD-10-CM | POA: Insufficient documentation

## 2021-10-05 DIAGNOSIS — R059 Cough, unspecified: Secondary | ICD-10-CM | POA: Diagnosis present

## 2021-10-05 LAB — SARS CORONAVIRUS 2 BY RT PCR: SARS Coronavirus 2 by RT PCR: NEGATIVE

## 2021-10-05 NOTE — ED Triage Notes (Signed)
Patient c/o runny nose, cough and congestion that started 2 days ago.  Patient reports fevers.  Patient reports some SOB.

## 2021-10-05 NOTE — ED Provider Notes (Signed)
[facility] [place]   Name: Kelly Harrell DOB: 02-15-2000 MRN: [id number] CSN: [id number] PCP: [facility]  Arrival date and time:  10/05/21 1120  Chief Complaint:  Cough and Nasal Congestion   NOTE: Prior to seeing the patient today, I have reviewed the triage nursing documentation and vital signs. Clinical staff has updated patient's PMH/PSHx, current medication list, and drug allergies/intolerances to ensure comprehensive history available to assist in medical decision making.   History:   HPI: Kelly Harrell is a 21 y.o. female who presents today with complaints of cough and sore throat.  Patient states the symptoms have been ongoing the past 48 hours.  She has tried over-the-counter Mucinex and has not noticed any improvement.  She denies any fevers, chills or body aches.  No COVID test at home.  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  Past Medical History:  Diagnosis Date   Ovarian cyst     Past Surgical History:  Procedure Laterality Date   LAPAROSCOPY N/A 10/26/2016   Procedure: LAPAROSCOPY DIAGNOSTIC;  Surgeon: Nadara Mustard, MD;  Location: [facility];  Service: Gynecology;  Laterality: N/A;    Family History  Problem Relation Age of Onset   Hypertension Mother    Diabetes Mother     Social History   Tobacco Use   Smoking status: Never   Smokeless tobacco: Never  Vaping Use   Vaping Use: Never used  Substance Use Topics   Alcohol use: No   Drug use: No    Patient Active Problem List   Diagnosis Date Noted   Encounter for surveillance of contraceptive pills 05/18/2017   Hemorrhagic ovarian cyst 10/26/2016   Abdominal pain, chronic, right lower quadrant 10/26/2016    Home Medications:    No outpatient medications have been marked as taking for the 10/05/21 encounter [facility] Encounter).    Allergies:   Patient has no known allergies.  Review of Systems  (ROS): Review of Systems  Constitutional:  Negative for activity change, appetite change, chills, diaphoresis and fatigue.  HENT:  Positive for postnasal drip and sore throat. Negative for congestion, sinus pain and sneezing.   Respiratory:  Positive for cough. Negative for shortness of breath and wheezing.   Cardiovascular:  Negative for chest pain.  Gastrointestinal:  Negative for diarrhea, nausea and vomiting.  All other systems reviewed and are negative.    Vital Signs: Today's Vitals   10/05/21 1140 10/05/21 1141 10/05/21 1144  BP:   112/72  Pulse:   85  Resp:   14  Temp:   98.4 F (36.9 C)  TempSrc:   Oral  SpO2:   100%  Weight:  118 lb (53.5 kg)   Height:  5\' 4"  (1.626 m)   PainSc: 2       Physical Exam: Physical Exam Vitals and nursing note reviewed.  Constitutional:      General: She is not in acute distress.    Appearance: She is well-developed.  HENT:     Head: Normocephalic and atraumatic.     Right Ear: A middle ear effusion is present.     Left Ear: A middle ear effusion is present.     Ears:     Comments: Clear middle ear effusion consistent with eustachian tube dysfunction    Nose:     Right Turbinates: Enlarged.     Left Turbinates: Enlarged.     Mouth/Throat:  Pharynx: Posterior oropharyngeal erythema present.     Tonsils: No tonsillar exudate or tonsillar abscesses.  Eyes:     Conjunctiva/sclera: Conjunctivae normal.  Cardiovascular:     Rate and Rhythm: Normal rate and regular rhythm.     Heart sounds: No murmur heard. Pulmonary:     Effort: Pulmonary effort is normal. No respiratory distress.     Breath sounds: Normal breath sounds.  Abdominal:     Palpations: Abdomen is soft.     Tenderness: There is no abdominal tenderness.  Musculoskeletal:     Cervical back: Neck supple.  Lymphadenopathy:     Cervical: No cervical adenopathy.  Skin:    General: Skin is warm and dry.  Neurological:     Mental Status: She is alert.       Urgent Care Treatments / Results:   LABS: PLEASE NOTE: all labs that were ordered this encounter are listed, however only abnormal results are displayed. Labs Reviewed  SARS CORONAVIRUS 2 BY RT PCR    EKG: -None  RADIOLOGY: No results found.  PROCEDURES: Procedures  MEDICATIONS RECEIVED THIS VISIT: Medications - No data to display  PERTINENT CLINICAL COURSE NOTES/UPDATES:   Initial Impression / Assessment and Plan / Urgent Care Course:  Pertinent labs & imaging results that were available during my care of the patient were personally reviewed by me and considered in my medical decision making (see lab/imaging section of note for values and interpretations).  Kelly Harrell is a 21 y.o. female who presents to [facility] today with complaints of cough, diagnosed with allergic bronchitis, and treated as such with the directions below. NP and patient reviewed discharge instructions below during visit.   Patient is well appearing overall in clinic today. She does not appear to be in any acute distress. Presenting symptoms (see HPI) and exam as documented above.   I have reviewed the follow up and strict return precautions for any new or worsening symptoms. Patient is aware of symptoms that would be deemed urgent/emergent, and would thus require further evaluation either here or in the emergency department. At the time of discharge, she verbalized understanding and consent with the discharge plan as it was reviewed with her. All questions were fielded by provider and/or clinic staff prior to patient discharge.    Final Clinical Impressions / Urgent Care Diagnoses:   Final diagnoses:  Bronchitis, allergic, unspecified asthma severity, uncomplicated    New Prescriptions:  [place] Controlled Substance Registry consulted? Not Applicable  No orders of the defined types were placed in this encounter.     Discharge Instructions      You were seen for cough and sore  throat and are being treated for allergies.   - Use antihistamines, such as cetirizine (Zyrtec) or loratadine (Claritin), to help with the drainage.  Over-the-counter Flonase 2 times a day will also help with sinus pressure.  Treat the pain with over-the-counter Tylenol and ibuprofen as needed.   - Monitor your symptoms to make sure that they get better.  If your symptoms do not get better in a week since they started, or they get worse after a week, you may need to get reevaluated.   Take care, Dr. Marland Kitchen, NP-c      Recommended Follow up Care:  Patient encouraged to follow up with the following provider within the specified time frame, or sooner as dictated by the severity of her symptoms. As always, she was instructed that for any urgent/emergent care needs, she should  seek care either here or in the emergency department for more immediate evaluation.   Gertie Baron, DNP, NP-c   Gertie Baron, NP 10/05/21 1353

## 2021-10-05 NOTE — Discharge Instructions (Signed)
You were seen for cough and sore throat and are being treated for allergies.   - Use antihistamines, such as cetirizine (Zyrtec) or loratadine (Claritin), to help with the drainage.  Over-the-counter Flonase 2 times a day will also help with sinus pressure.  Treat the pain with over-the-counter Tylenol and ibuprofen as needed.   - Monitor your symptoms to make sure that they get better.  If your symptoms do not get better in a week since they started, or they get worse after a week, you may need to get reevaluated.   Take care, Dr. Marland Kitchen, NP-c

## 2021-10-06 DIAGNOSIS — Z419 Encounter for procedure for purposes other than remedying health state, unspecified: Secondary | ICD-10-CM | POA: Diagnosis not present

## 2021-10-08 DIAGNOSIS — Z825 Family history of asthma and other chronic lower respiratory diseases: Secondary | ICD-10-CM | POA: Diagnosis not present

## 2021-10-08 DIAGNOSIS — R062 Wheezing: Secondary | ICD-10-CM | POA: Diagnosis not present

## 2021-10-08 DIAGNOSIS — K219 Gastro-esophageal reflux disease without esophagitis: Secondary | ICD-10-CM | POA: Diagnosis not present

## 2021-10-08 DIAGNOSIS — R059 Cough, unspecified: Secondary | ICD-10-CM | POA: Diagnosis not present

## 2021-10-08 DIAGNOSIS — Z79899 Other long term (current) drug therapy: Secondary | ICD-10-CM | POA: Diagnosis not present

## 2021-10-09 DIAGNOSIS — Z026 Encounter for examination for insurance purposes: Secondary | ICD-10-CM | POA: Diagnosis not present

## 2021-10-09 DIAGNOSIS — W460XXA Contact with hypodermic needle, initial encounter: Secondary | ICD-10-CM | POA: Diagnosis not present

## 2021-10-09 DIAGNOSIS — S61238A Puncture wound without foreign body of other finger without damage to nail, initial encounter: Secondary | ICD-10-CM | POA: Diagnosis not present

## 2021-11-06 DIAGNOSIS — Z419 Encounter for procedure for purposes other than remedying health state, unspecified: Secondary | ICD-10-CM | POA: Diagnosis not present

## 2021-11-08 IMAGING — CR DG CHEST 2V
1 series · 2 of 2 positions shown · non-contrast
Comparison: 12/12/2017

CLINICAL DATA: Chest pain

EXAM:
CHEST - 2 VIEW

[Series 1: dg chest 2 view · 0.14mm/px · 2 of 2 slices shown]
[im 1/2]
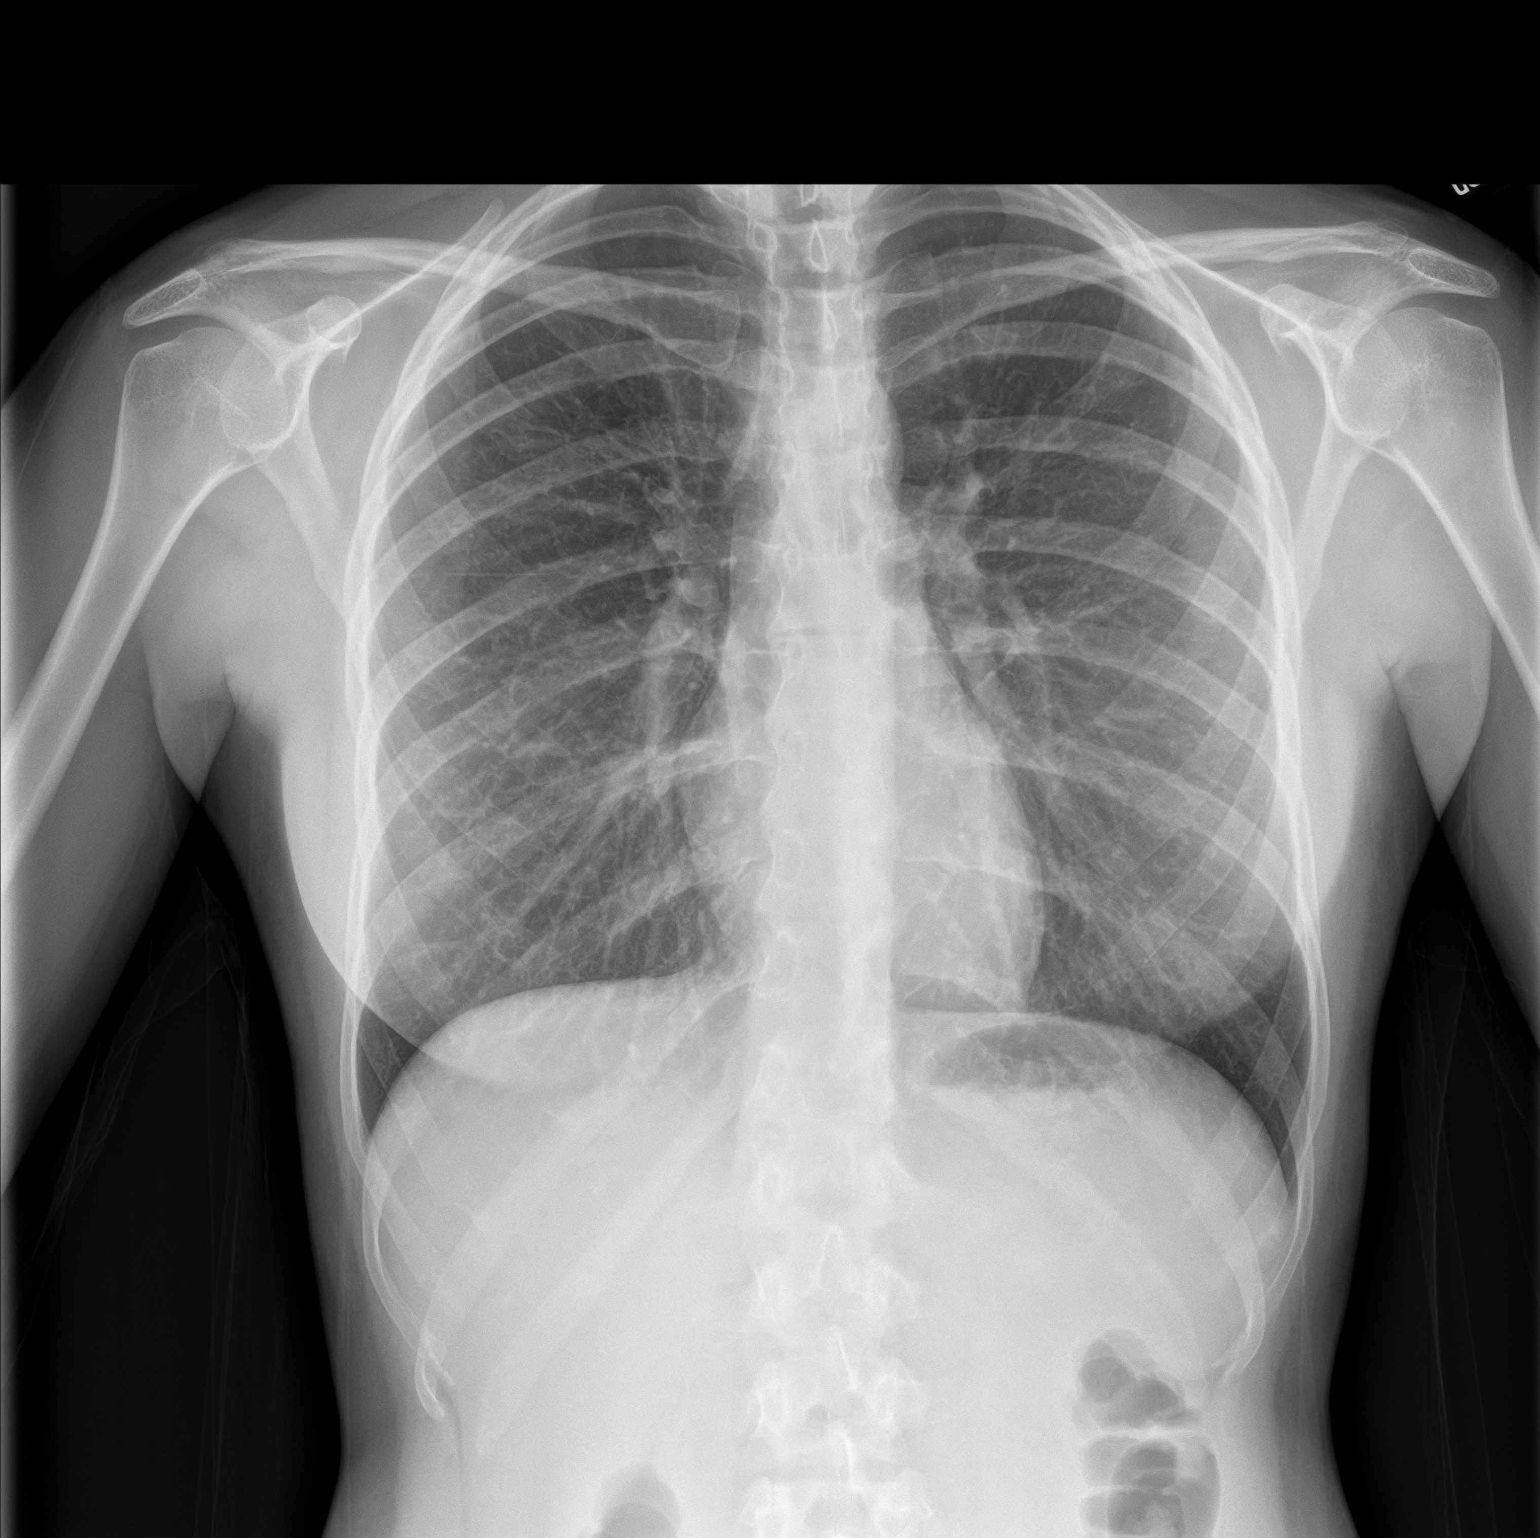
[im 2/2]
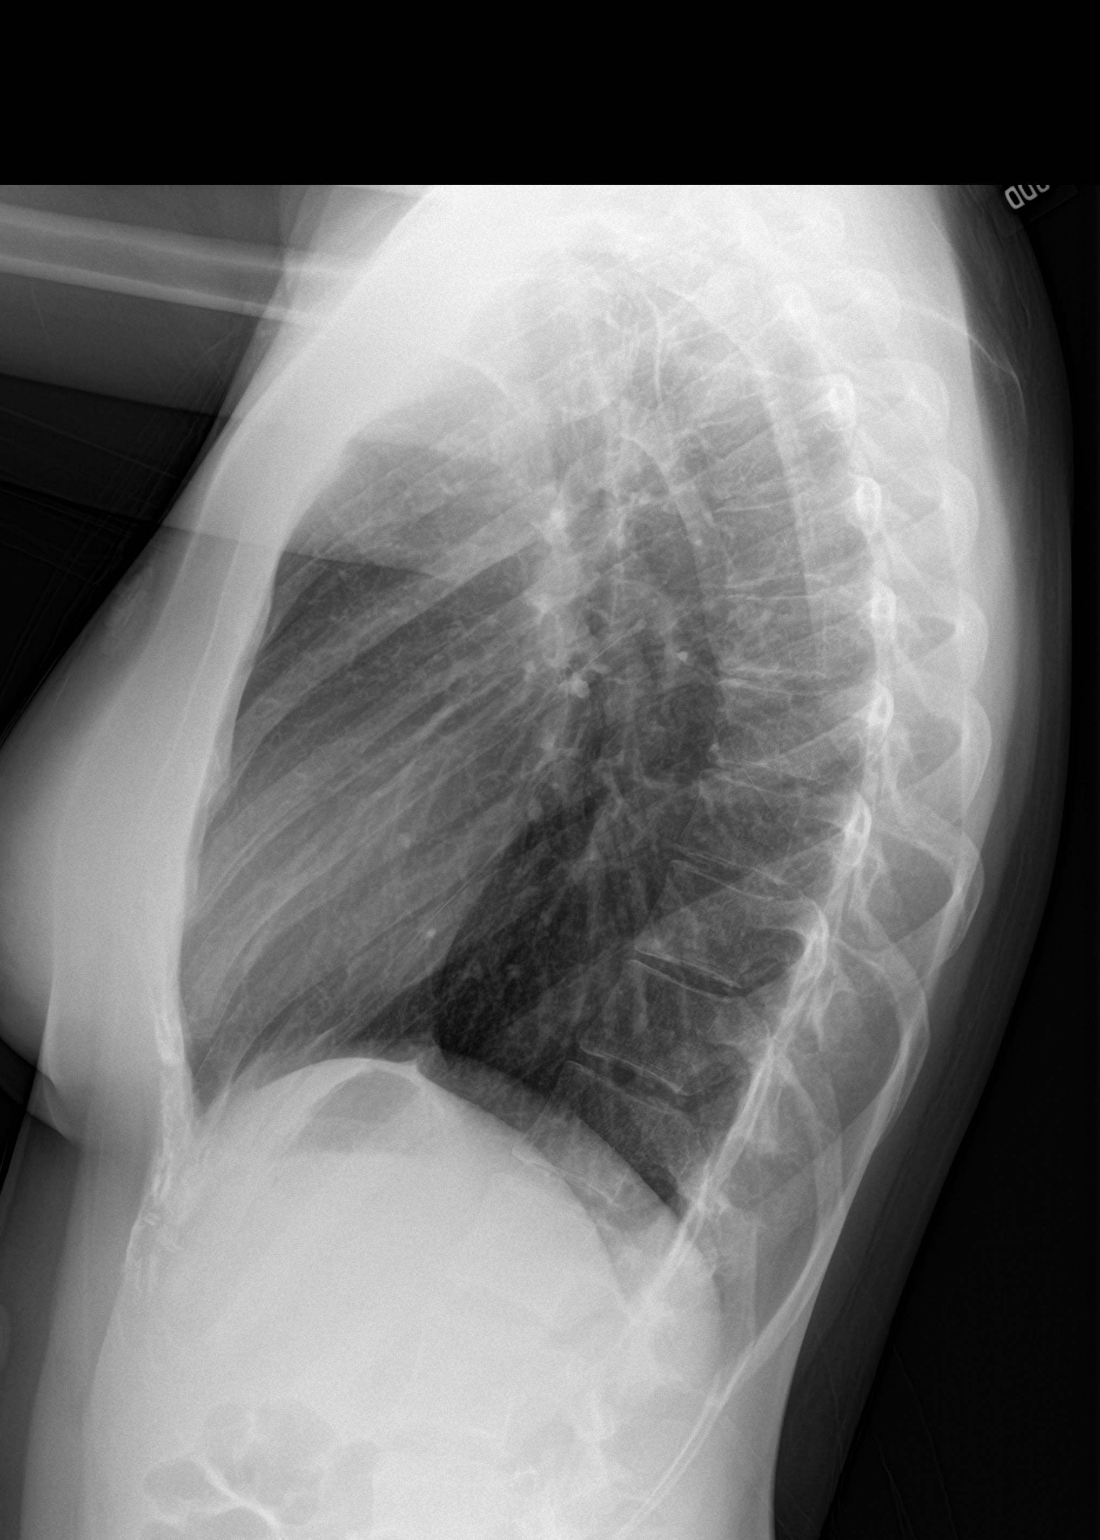

[2 of 2 positions shown; findings below may reference images not displayed]

FINDINGS: The heart size and mediastinal contours are within normal limits.
Both lungs are clear. The visualized skeletal structures are
unremarkable.
IMPRESSION: No active cardiopulmonary disease.

## 2021-11-18 ENCOUNTER — Ambulatory Visit
Admission: EM | Admit: 2021-11-18 | Discharge: 2021-11-18 | Disposition: A | Discharge status: Home / Self Care | Payer: Medicaid Other | Attending: Emergency Medicine | Admitting: Emergency Medicine

## 2021-11-18 ENCOUNTER — Encounter: Payer: Self-pay | Admitting: Emergency Medicine

## 2021-11-18 DIAGNOSIS — K529 Noninfective gastroenteritis and colitis, unspecified: Secondary | ICD-10-CM | POA: Diagnosis not present

## 2021-11-18 DIAGNOSIS — Z1152 Encounter for screening for COVID-19: Secondary | ICD-10-CM | POA: Insufficient documentation

## 2021-11-18 DIAGNOSIS — Z79899 Other long term (current) drug therapy: Secondary | ICD-10-CM | POA: Insufficient documentation

## 2021-11-18 DIAGNOSIS — E860 Dehydration: Secondary | ICD-10-CM | POA: Diagnosis not present

## 2021-11-18 LAB — COMPREHENSIVE METABOLIC PANEL
ALT: 12 U/L (ref 0–44)
AST: 16 U/L (ref 15–41)
Albumin: 4.7 g/dL (ref 3.5–5.0)
Alkaline Phosphatase: 39 U/L (ref 38–126)
Anion gap: 7 (ref 5–15)
BUN: 16 mg/dL (ref 6–20)
CO2: 24 mmol/L (ref 22–32)
Calcium: 8.9 mg/dL (ref 8.9–10.3)
Chloride: 105 mmol/L (ref 98–111)
Creatinine, Ser: 0.64 mg/dL (ref 0.44–1.00)
GFR, Estimated: >60 mL/min (ref >60)
Glucose, Bld: 85 mg/dL (ref 70–99)
Potassium: 3.8 mmol/L (ref 3.5–5.1)
Sodium: 136 mmol/L (ref 135–145)
Total Bilirubin: 1 mg/dL (ref 0.3–1.2)
Total Protein: 7.9 g/dL (ref 6.5–8.1)

## 2021-11-18 LAB — CBC WITH DIFFERENTIAL/PLATELET
Abs Immature Granulocytes: 0.01 10*3/uL (ref 0.00–0.07)
Basophils Absolute: 0 10*3/uL (ref 0.0–0.1)
Basophils Relative: 1 %
Eosinophils Absolute: 0.1 10*3/uL (ref 0.0–0.5)
Eosinophils Relative: 2 %
HCT: 42.9 % (ref 36.0–46.0)
Hemoglobin: 14.6 g/dL (ref 12.0–15.0)
Immature Granulocytes: 0 %
Lymphocytes Relative: 32 %
Lymphs Abs: 2 10*3/uL (ref 0.7–4.0)
MCH: 28.9 pg (ref 26.0–34.0)
MCHC: 34 g/dL (ref 30.0–36.0)
MCV: 84.8 fL (ref 80.0–100.0)
Monocytes Absolute: 0.5 10*3/uL (ref 0.1–1.0)
Monocytes Relative: 7 %
Neutro Abs: 3.7 10*3/uL (ref 1.7–7.7)
Neutrophils Relative %: 58 %
Platelets: 195 10*3/uL (ref 150–400)
RBC: 5.06 MIL/uL (ref 3.87–5.11)
RDW: 12 % (ref 11.5–15.5)
WBC: 6.3 10*3/uL (ref 4.0–10.5)
nRBC: 0 % (ref 0.0–0.2)

## 2021-11-18 LAB — URINALYSIS, ROUTINE W REFLEX MICROSCOPIC
Bilirubin Urine: NEGATIVE
Glucose, UA: NEGATIVE mg/dL
Hgb urine dipstick: NEGATIVE
Ketones, ur: 80 mg/dL — AB
Leukocytes,Ua: NEGATIVE
Nitrite: NEGATIVE
Protein, ur: 30 mg/dL — AB
Specific Gravity, Urine: >1.03 — ABNORMAL HIGH (ref 1.005–1.030)
pH: 5.5 (ref 5.0–8.0)

## 2021-11-18 LAB — LIPASE, BLOOD: Lipase: 30 U/L (ref 11–51)

## 2021-11-18 LAB — URINALYSIS, MICROSCOPIC (REFLEX)

## 2021-11-18 LAB — PREGNANCY, URINE: Preg Test, Ur: NEGATIVE

## 2021-11-18 LAB — GLUCOSE, CAPILLARY: Glucose-Capillary: 79 mg/dL (ref 70–99)

## 2021-11-18 LAB — RESP PANEL BY RT-PCR (FLU A&B, COVID) ARPGX2
Influenza A by PCR: NEGATIVE
Influenza B by PCR: NEGATIVE
SARS Coronavirus 2 by RT PCR: NEGATIVE

## 2021-11-18 MED ORDER — ONDANSETRON 4 MG PO TBDP: ORAL_TABLET | ORAL | 0 refills | Status: DC

## 2021-11-18 MED ORDER — ONDANSETRON 4 MG PO TBDP
4.0000 mg | ORAL_TABLET | Freq: Once | ORAL | Status: AC
  Administered 2021-11-18: 4 mg via ORAL

## 2021-11-18 NOTE — Discharge Instructions (Addendum)
Push electrolyte containing fluids such as Pedialyte, liquid IV or Gatorade.  Probiotics can be helpful.  Zofran will help with the nausea and vomiting, and will also slow down the diarrhea.  All of your lab work is negative.  Follow-up with the primary care provider that we set you up with, and go to the ER if you get worse, or if you have not urinated in 12 hours, if you are throwing up despite the Zofran, or for other concerns.

## 2021-11-18 NOTE — ED Triage Notes (Signed)
Pt presents with nausea, vomiting and diarrhea x 2 days. Pt states she has diarrhea after every meal

## 2021-11-18 NOTE — ED Provider Notes (Signed)
HPI  SUBJECTIVE:  Kelly Harrell is a 21 y.o. female who presents with 3 days of watery, nonbloody diarrhea with burning rectal pain occurring every time after she tries to eat.  She is having approximately 3 episodes a day.  She reports constant nausea and 1 episode of nonbilious, nonbloody emesis yesterday.  She reports intermittent, minutes long achy lower abdominal pain, tenderness.  She reports a sensation of increased movement in her bowels.  She has tried over-the-counter nausea medicine, Pepcid, and ginger ale.  The ginger ale helps with the nausea.  Symptoms are worse with eating, and the abdominal pain is worse before having a bowel movement or vomiting.  It does not change afterwards.  No urinary complaints, vaginal bleeding, odor, discharge.  She reports body aches, lightheadedness and feeling shaky.  No fevers, headaches, nasal congestion, rhinorrhea, loss of sense of smell or taste, sore throat, cough, wheeze, shortness of breath, change in urine output, abdominal distention.  No travel, raw or undercooked foods, questionable leftovers, contacts with similar illness, recent antibiotics.  No known COVID or flu exposure.  She got 2 doses of the COVID-vaccine and this years flu vaccine.  No antipyretic in the past 6 hours.  She has a past medical history of ruptured ovarian cyst status post surgery for this.  No history of diabetes, atrial fibrillation, pancreatitis, gallbladder disease, C. difficile colitis, UTI, ulcerative colitis, Crohn's, other abdominal surgeries, excess NSAID or alcohol use.  LMP: 3 weeks ago.  Denies possibility being pregnant.  PCP: None.    Past Medical History:  Diagnosis Date   Ovarian cyst     Past Surgical History:  Procedure Laterality Date   LAPAROSCOPY N/A 10/26/2016   Procedure: LAPAROSCOPY DIAGNOSTIC;  Surgeon: Gae Dry, MD;  Location: [facility] ORS;  Service: Gynecology;  Laterality: N/A;    Family History  Problem Relation Age of Onset    Hypertension Mother    Diabetes Mother     Social History   Tobacco Use   Smoking status: Never   Smokeless tobacco: Never  Vaping Use   Vaping Use: Never used  Substance Use Topics   Alcohol use: No   Drug use: No    No current facility-administered medications for this encounter.  Current Outpatient Medications:    ondansetron (ZOFRAN-ODT) 4 MG disintegrating tablet, 1 tablet q 8 hr prn nausea, vomiting, Disp: 20 tablet, Rfl: 0   drospirenone-ethinyl estradiol (YAZ) 3-0.02 MG tablet, TAKE 1 TABLET BY MOUTH DAILY, Disp: 84 tablet, Rfl: 3   SUMAtriptan (IMITREX) 50 MG tablet, Take 1 tablet (50 mg total) by mouth every 2 (two) hours as needed for migraine. May repeat in 2 hours if headache persists or recurs., Disp: 10 tablet, Rfl: 0  No Known Allergies   ROS  As noted in HPI.   Physical Exam  BP 132/86 (BP Location: Left Arm)   Pulse 82   Temp 98 F (36.7 C) (Oral)   Resp 16   LMP 10/28/2021 (Approximate)   SpO2 100%   Constitutional: Well developed, well nourished, no acute distress Eyes:  EOMI, conjunctiva normal bilaterally HENT: Normocephalic, atraumatic,mucus membranes moist Respiratory: Normal inspiratory effort Cardiovascular: Normal rate, regular rhythm, no murmurs, rubs, gallops.  Cap refill less than 2 seconds. GI: nondistended.  Positive suprapubic, periumbilical, right upper quadrant tenderness.  Negative Murphy.  Active bowel sounds.  No rebound, guarding.  No other abdominal tenderness. Back: No CVAT skin: No rash, skin intact Musculoskeletal: no deformities Neurologic: Alert & oriented x  3, no focal neuro deficits Psychiatric: Speech and behavior appropriate   ED Course   Medications  ondansetron (ZOFRAN-ODT) disintegrating tablet 4 mg (4 mg Oral Given 11/18/21 1925)    Orders Placed This Encounter  Procedures   Resp Panel by RT-PCR (Flu A&B, Covid) Anterior Nasal Swab    Standing Status:   Standing    Number of Occurrences:   1    Urinalysis, Routine w reflex microscopic Urine, Clean Catch    Standing Status:   Standing    Number of Occurrences:   1   Comprehensive metabolic panel    Standing Status:   Standing    Number of Occurrences:   1   CBC with Differential    Standing Status:   Standing    Number of Occurrences:   1   Lipase, blood    Standing Status:   Standing    Number of Occurrences:   1   Pregnancy, urine    Standing Status:   Standing    Number of Occurrences:   1   Glucose, capillary    Standing Status:   Standing    Number of Occurrences:   1   Urinalysis, Microscopic (reflex)    Standing Status:   Standing    Number of Occurrences:   1   Nursing Communication Set up with pcp b/f dc    Set up with pcp b/f dc    Standing Status:   Standing    Number of Occurrences:   1   Offer Fluids    Standing Status:   Standing    Number of Occurrences:   20   CBG monitoring, ED    Standing Status:   Standing    Number of Occurrences:   1    Results for orders placed or performed during the hospital encounter of 11/18/21 (from the past 24 hour(s))  Resp Panel by RT-PCR (Flu A&B, Covid) Anterior Nasal Swab     Status: None   Collection Time: 11/18/21  7:25 PM   Specimen: Anterior Nasal Swab  Result Value Ref Range   SARS Coronavirus 2 by RT PCR NEGATIVE NEGATIVE   Influenza A by PCR NEGATIVE NEGATIVE   Influenza B by PCR NEGATIVE NEGATIVE  Urinalysis, Routine w reflex microscopic Anterior Nasal Swab     Status: Abnormal   Collection Time: 11/18/21  7:25 PM  Result Value Ref Range   Color, Urine YELLOW YELLOW   APPearance CLEAR CLEAR   Specific Gravity, Urine >1.030 (H) 1.005 - 1.030   pH 5.5 5.0 - 8.0   Glucose, UA NEGATIVE NEGATIVE mg/dL   Hgb urine dipstick NEGATIVE NEGATIVE   Bilirubin Urine NEGATIVE NEGATIVE   Ketones, ur 80 (A) NEGATIVE mg/dL   Protein, ur 30 (A) NEGATIVE mg/dL   Nitrite NEGATIVE NEGATIVE   Leukocytes,Ua NEGATIVE NEGATIVE  Comprehensive metabolic panel     Status:  None   Collection Time: 11/18/21  7:25 PM  Result Value Ref Range   Sodium 136 135 - 145 mmol/L   Potassium 3.8 3.5 - 5.1 mmol/L   Chloride 105 98 - 111 mmol/L   CO2 24 22 - 32 mmol/L   Glucose, Bld 85 70 - 99 mg/dL   BUN 16 6 - 20 mg/dL   Creatinine, Ser 0.64 0.44 - 1.00 mg/dL   Calcium 8.9 8.9 - 10.3 mg/dL   Total Protein 7.9 6.5 - 8.1 g/dL   Albumin 4.7 3.5 - 5.0 g/dL   AST 16 15 -  41 U/L   ALT 12 0 - 44 U/L   Alkaline Phosphatase 39 38 - 126 U/L   Total Bilirubin 1.0 0.3 - 1.2 mg/dL   GFR, Estimated >60 >60 mL/min   Anion gap 7 5 - 15  CBC with Differential     Status: None   Collection Time: 11/18/21  7:25 PM  Result Value Ref Range   WBC 6.3 4.0 - 10.5 K/uL   RBC 5.06 3.87 - 5.11 MIL/uL   Hemoglobin 14.6 12.0 - 15.0 g/dL   HCT 42.9 36.0 - 46.0 %   MCV 84.8 80.0 - 100.0 fL   MCH 28.9 26.0 - 34.0 pg   MCHC 34.0 30.0 - 36.0 g/dL   RDW 12.0 11.5 - 15.5 %   Platelets 195 150 - 400 K/uL   nRBC 0.0 0.0 - 0.2 %   Neutrophils Relative % 58 %   Neutro Abs 3.7 1.7 - 7.7 K/uL   Lymphocytes Relative 32 %   Lymphs Abs 2.0 0.7 - 4.0 K/uL   Monocytes Relative 7 %   Monocytes Absolute 0.5 0.1 - 1.0 K/uL   Eosinophils Relative 2 %   Eosinophils Absolute 0.1 0.0 - 0.5 K/uL   Basophils Relative 1 %   Basophils Absolute 0.0 0.0 - 0.1 K/uL   Immature Granulocytes 0 %   Abs Immature Granulocytes 0.01 0.00 - 0.07 K/uL  Lipase, blood     Status: None   Collection Time: 11/18/21  7:25 PM  Result Value Ref Range   Lipase 30 11 - 51 U/L  Urinalysis, Microscopic (reflex)     Status: Abnormal   Collection Time: 11/18/21  7:25 PM  Result Value Ref Range   RBC / HPF 0-5 0 - 5 RBC/hpf   WBC, UA 0-5 0 - 5 WBC/hpf   Bacteria, UA FEW (A) NONE SEEN   Squamous Epithelial / LPF 0-5 0 - 5   Mucus PRESENT   Pregnancy, urine     Status: None   Collection Time: 11/18/21  7:27 PM  Result Value Ref Range   Preg Test, Ur NEGATIVE NEGATIVE  Glucose, capillary     Status: None   Collection Time:  11/18/21  7:36 PM  Result Value Ref Range   Glucose-Capillary 79 70 - 99 mg/dL   No results found.  ED Clinical Impression  1. Gastroenteritis      ED Assessment/Plan     Checking CBC, CMP, lipase, UA, urine pregnancy, respiratory panel.  Zofran 4 milligrams ODT, will offer fluids.  Urine pregnancy negative.  CBC, CMP, lipase normal.  Urine has a few bacteria, but negative nitrites, esterase.  She has no urinary complaints.  Will not send this off for culture.  Her urine shows dehydration.  Her COVID, influenza is negative.  will have staff set patient up with a PCP prior to discharge.  Sensation consistent with a viral gastroenteritis. Pt abd exam is benign, no peritoneal signs. No evidence of surgical abd. Doubt SBO, mesenteric ischemia, appendicitis, hepatitis, cholecystitis, pancreatitis, or perforated viscus. No evidence to support or suggest GYN pathology such as ovarian torsion or infection.  Home with probiotics, Zofran, push electrolyte containing fluids.  Follow-up with PCP as scheduled.  ER return precautions given  Discussed labs, MDM, treatment plan, and plan for follow-up with patient. Discussed sn/sx that should prompt return to the ED. patient agrees with plan.   Meds ordered this encounter  Medications   ondansetron (ZOFRAN-ODT) disintegrating tablet 4 mg   ondansetron (ZOFRAN-ODT)  4 MG disintegrating tablet    Sig: 1 tablet q 8 hr prn nausea, vomiting    Dispense:  20 tablet    Refill:  0      *This clinic note was created using Scientist, clinical (histocompatibility and immunogenetics). Therefore, there may be occasional mistakes despite careful proofreading.  ?    Domenick Gong, MD 11/19/21 1436

## 2021-12-06 DIAGNOSIS — Z419 Encounter for procedure for purposes other than remedying health state, unspecified: Secondary | ICD-10-CM | POA: Diagnosis not present

## 2022-01-06 DIAGNOSIS — Z419 Encounter for procedure for purposes other than remedying health state, unspecified: Secondary | ICD-10-CM | POA: Diagnosis not present

## 2022-01-08 ENCOUNTER — Telehealth: Payer: Self-pay

## 2022-01-08 NOTE — Telephone Encounter (Signed)
Lvm for pt in regards to [facility]    Copied from [place] [id number]. Topic: Appointment Scheduling - New Patient >> Jan 08, 2022 12:36 PM Oley Balm A wrote: Reason for CRM: Patient is wanting to see if she can get a later appointment or if she can get a new patient appointment next week with Serafina Royals or with another provider.   New patient has been scheduled for your office. Provider: Serafina Royals Date of Appointment: 01/09/22   2:20pm  Route to department's PEC pool.

## 2022-01-09 ENCOUNTER — Ambulatory Visit: Payer: Medicaid Other | Admitting: Nurse Practitioner

## 2022-02-06 DIAGNOSIS — Z419 Encounter for procedure for purposes other than remedying health state, unspecified: Secondary | ICD-10-CM | POA: Diagnosis not present

## 2022-02-16 ENCOUNTER — Ambulatory Visit
Admission: EM | Admit: 2022-02-16 | Discharge: 2022-02-16 | Disposition: A | Discharge status: Home / Self Care | Payer: Medicaid Other | Attending: Emergency Medicine | Admitting: Emergency Medicine

## 2022-02-16 DIAGNOSIS — R058 Other specified cough: Secondary | ICD-10-CM | POA: Insufficient documentation

## 2022-02-16 DIAGNOSIS — R112 Nausea with vomiting, unspecified: Secondary | ICD-10-CM

## 2022-02-16 DIAGNOSIS — R1031 Right lower quadrant pain: Secondary | ICD-10-CM | POA: Diagnosis not present

## 2022-02-16 DIAGNOSIS — R1110 Vomiting, unspecified: Secondary | ICD-10-CM | POA: Diagnosis not present

## 2022-02-16 DIAGNOSIS — R0989 Other specified symptoms and signs involving the circulatory and respiratory systems: Secondary | ICD-10-CM | POA: Insufficient documentation

## 2022-02-16 DIAGNOSIS — R197 Diarrhea, unspecified: Secondary | ICD-10-CM | POA: Diagnosis not present

## 2022-02-16 DIAGNOSIS — Z1152 Encounter for screening for COVID-19: Secondary | ICD-10-CM | POA: Insufficient documentation

## 2022-02-16 DIAGNOSIS — H9202 Otalgia, left ear: Secondary | ICD-10-CM | POA: Diagnosis not present

## 2022-02-16 DIAGNOSIS — B349 Viral infection, unspecified: Secondary | ICD-10-CM | POA: Diagnosis not present

## 2022-02-16 DIAGNOSIS — N83201 Unspecified ovarian cyst, right side: Secondary | ICD-10-CM | POA: Diagnosis not present

## 2022-02-16 LAB — RESP PANEL BY RT-PCR (RSV, FLU A&B, COVID)  RVPGX2
Influenza A by PCR: NEGATIVE
Influenza B by PCR: NEGATIVE
Resp Syncytial Virus by PCR: NEGATIVE
SARS Coronavirus 2 by RT PCR: NEGATIVE

## 2022-02-16 LAB — CBC WITH DIFFERENTIAL/PLATELET
Abs Immature Granulocytes: 0.04 10*3/uL (ref 0.00–0.07)
Basophils Absolute: 0 10*3/uL (ref 0.0–0.1)
Basophils Relative: 0 %
Eosinophils Absolute: 0.1 10*3/uL (ref 0.0–0.5)
Eosinophils Relative: 1 %
HCT: 43.3 % (ref 36.0–46.0)
Hemoglobin: 15 g/dL (ref 12.0–15.0)
Immature Granulocytes: 1 %
Lymphocytes Relative: 9 %
Lymphs Abs: 0.6 10*3/uL — ABNORMAL LOW (ref 0.7–4.0)
MCH: 29.2 pg (ref 26.0–34.0)
MCHC: 34.6 g/dL (ref 30.0–36.0)
MCV: 84.4 fL (ref 80.0–100.0)
Monocytes Absolute: 0.4 10*3/uL (ref 0.1–1.0)
Monocytes Relative: 6 %
Neutro Abs: 6 10*3/uL (ref 1.7–7.7)
Neutrophils Relative %: 83 %
Platelets: 138 10*3/uL — ABNORMAL LOW (ref 150–400)
RBC: 5.13 MIL/uL — ABNORMAL HIGH (ref 3.87–5.11)
RDW: 12.2 % (ref 11.5–15.5)
WBC: 7.1 10*3/uL (ref 4.0–10.5)
nRBC: 0 % (ref 0.0–0.2)

## 2022-02-16 LAB — MONONUCLEOSIS SCREEN: Mono Screen: NEGATIVE

## 2022-02-16 LAB — GROUP A STREP BY PCR: Group A Strep by PCR: NOT DETECTED

## 2022-02-16 MED ORDER — BENZONATATE 100 MG PO CAPS: 200.0000 mg | ORAL_CAPSULE | Freq: Three times a day (TID) | ORAL | 0 refills | Status: DC

## 2022-02-16 MED ORDER — ONDANSETRON 4 MG PO TBDP
4.0000 mg | ORAL_TABLET | Freq: Once | ORAL | Status: AC
  Administered 2022-02-16: 4 mg via ORAL

## 2022-02-16 MED ORDER — IPRATROPIUM BROMIDE 0.06 % NA SOLN: 2.0000 | Freq: Four times a day (QID) | NASAL | 12 refills | Status: DC

## 2022-02-16 MED ORDER — PROMETHAZINE-DM 6.25-15 MG/5ML PO SYRP: 5.0000 mL | ORAL_SOLUTION | Freq: Four times a day (QID) | ORAL | 0 refills | Status: DC | PRN

## 2022-02-16 MED ORDER — ONDANSETRON 8 MG PO TBDP: 8.0000 mg | ORAL_TABLET | Freq: Three times a day (TID) | ORAL | 0 refills | Status: DC | PRN

## 2022-02-16 NOTE — ED Provider Notes (Signed)
[facility]    CSN: [id number] Arrival date & time: 02/16/22  1045      History   Chief Complaint Chief Complaint  Patient presents with   Abdominal Pain    Sore throat, cough, vomiting and diarrhea. - Entered by patient   Cough   Diarrhea   Emesis    HPI Kelly Harrell is a 22 y.o. female.   HPI  22 year old female here for evaluation of multiple complaints.  The patient reports that her symptoms started 3 days ago with abdominal pain, cough, and a sore throat.  She is also had runny nose, nasal congestion, and pain in her left ear.  Yesterday she developed vomiting and diarrhea.  She states has been unable to keep down fluids or food secondary to the vomiting and diarrhea.  She has had a subjective fever but she has not measured it at home.  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  Past Medical History:  Diagnosis Date   Ovarian cyst     Patient Active Problem List   Diagnosis Date Noted   Encounter for surveillance of contraceptive pills 05/18/2017   Hemorrhagic ovarian cyst 10/26/2016   Abdominal pain, chronic, right lower quadrant 10/26/2016   Post-concussion headache 06/08/2015    Past Surgical History:  Procedure Laterality Date   LAPAROSCOPY N/A 10/26/2016   Procedure: LAPAROSCOPY DIAGNOSTIC;  Surgeon: Gae Dry, MD;  Location: [facility] ORS;  Service: Gynecology;  Laterality: N/A;    OB History   No obstetric history on file.      Home Medications    Prior to Admission medications   Medication Sig Start Date End Date Taking? Authorizing Provider  albuterol (VENTOLIN HFA) 108 (90 Base) MCG/ACT inhaler Inhale into the lungs. 10/08/21 10/08/22 Yes [provider]  benzonatate (TESSALON) 100 MG capsule Take 2 capsules (200 mg total) by mouth every 8 (eight) hours. 02/16/22  Yes Margarette Canada, NP  ipratropium (ATROVENT) 0.06 % nasal spray Place 2 sprays into both nostrils 4  (four) times daily. 02/16/22  Yes Margarette Canada, NP  ondansetron (ZOFRAN-ODT) 8 MG disintegrating tablet Take 1 tablet (8 mg total) by mouth every 8 (eight) hours as needed for nausea or vomiting. 02/16/22  Yes Margarette Canada, NP  promethazine-dextromethorphan (PROMETHAZINE-DM) 6.25-15 MG/5ML syrup Take 5 mLs by mouth 4 (four) times daily as needed. 02/16/22  Yes Margarette Canada, NP  drospirenone-ethinyl estradiol (YAZ) 3-0.02 MG tablet TAKE 1 TABLET BY MOUTH DAILY 07/20/20 10/18/20  Gae Dry, MD  SUMAtriptan (IMITREX) 50 MG tablet Take 1 tablet (50 mg total) by mouth every 2 (two) hours as needed for migraine. May repeat in 2 hours if headache persists or recurs. 11/24/20   Jodi Marble, MD    Family History Family History  Problem Relation Age of Onset   Hypertension Mother    Diabetes Mother     Social History Social History   Tobacco Use   Smoking status: Never   Smokeless tobacco: Never  Vaping Use   Vaping Use: Never used  Substance Use Topics   Alcohol use: No   Drug use: No     Allergies   Patient has no known allergies.   Review of Systems Review of Systems  Constitutional:  Positive for fever.  HENT:  Positive for congestion, ear pain, rhinorrhea and sore throat.   Respiratory:  Positive for cough. Negative for shortness of breath and wheezing.  Gastrointestinal:  Positive for abdominal pain, diarrhea, nausea and vomiting.     Physical Exam Triage Vital Signs ED Triage Vitals  Enc Vitals Group     BP 02/16/22 1130 111/71     Pulse Rate 02/16/22 1130 86     Resp 02/16/22 1130 16     Temp 02/16/22 1130 98.4 F (36.9 C)     Temp Source 02/16/22 1130 Oral     SpO2 02/16/22 1130 96 %     Weight --      Height --      Head Circumference --      Peak Flow --      Pain Score 02/16/22 1127 8     Pain Loc --      Pain Edu? --      Excl. in [place]? --    No data found.  Updated Vital Signs BP 111/71 (BP Location: Left Arm)   Pulse 86   Temp 98.4 F  (36.9 C) (Oral)   Resp 16   LMP 01/22/2022   SpO2 96%   Visual Acuity Right Eye Distance:   Left Eye Distance:   Bilateral Distance:    Right Eye Near:   Left Eye Near:    Bilateral Near:     Physical Exam Vitals and nursing note reviewed.  Constitutional:      Appearance: Normal appearance. She is not ill-appearing.  HENT:     Head: Normocephalic and atraumatic.     Right Ear: Tympanic membrane, ear canal and external ear normal. There is no impacted cerumen.     Left Ear: Tympanic membrane, ear canal and external ear normal. There is no impacted cerumen.     Nose: Congestion and rhinorrhea present.     Comments: Nasal mucosa is mildly erythematous and edematous with clear discharge in both nares.    Mouth/Throat:     Mouth: Mucous membranes are moist.     Pharynx: Oropharynx is clear. Posterior oropharyngeal erythema present. No oropharyngeal exudate.     Comments: Tonsillar pillars are unremarkable but she does have erythema to her soft palate.  Posterior oropharynx also reveals erythema and injection with clear postnasal drip. Cardiovascular:     Rate and Rhythm: Normal rate and regular rhythm.     Pulses: Normal pulses.     Heart sounds: Normal heart sounds. No murmur heard.    No friction rub. No gallop.  Pulmonary:     Effort: Pulmonary effort is normal.     Breath sounds: Normal breath sounds. No wheezing, rhonchi or rales.  Abdominal:     General: Abdomen is flat.     Palpations: Abdomen is soft.     Tenderness: There is abdominal tenderness. There is no guarding or rebound.  Musculoskeletal:     Cervical back: Normal range of motion and neck supple.  Lymphadenopathy:     Cervical: No cervical adenopathy.  Skin:    General: Skin is warm and dry.     Capillary Refill: Capillary refill takes less than 2 seconds.     Findings: No erythema or rash.  Neurological:     General: No focal deficit present.     Mental Status: She is alert and oriented to person,  place, and time.  Psychiatric:        Mood and Affect: Mood normal.        Behavior: Behavior normal.        Thought Content: Thought content normal.  Judgment: Judgment normal.      UC Treatments / Results  Labs (all labs ordered are listed, but only abnormal results are displayed) Labs Reviewed  CBC WITH DIFFERENTIAL/PLATELET - Abnormal; Notable for the following components:      Result Value   RBC 5.13 (*)    Platelets 138 (*)    Lymphs Abs 0.6 (*)    All other components within normal limits  GROUP A STREP BY PCR  RESP PANEL BY RT-PCR (RSV, FLU A&B, COVID)  [id number]  MONONUCLEOSIS SCREEN    EKG   Radiology No results found.  Procedures Procedures (including critical care time)  Medications Ordered in UC Medications  ondansetron (ZOFRAN-ODT) disintegrating tablet 4 mg (4 mg Oral Given 02/16/22 1153)    Initial Impression / Assessment and Plan / UC Course  I have reviewed the triage vital signs and the nursing notes.  Pertinent labs & imaging results that were available during my care of the patient were reviewed by me and considered in my medical decision making (see chart for details).   Patient is a very pleasant, nontoxic-appearing 22 year old female here for evaluation of respiratory and GI symptoms that began 3 days ago and intensified yesterday with the onset of vomiting and diarrhea.  Presenting symptoms were fever, runny nose, nasal congestion, sore throat, and abdominal pain.  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  On exam she does have inflamed nasal mucosa with clear rhinorrhea and erythema to the posterior oropharynx and as well as the soft palate.  Her tonsillar pillars are unremarkable.  I will order a strep PCR given the erythema of the soft palate.  I will also order a respiratory panel to look for the presence of COVID.  She is outside the window for therapeutic intervention if she  has influenza.  On her abdominal exam she has some epigastric tenderness as well as some right lower quadrant tenderness without guarding or rebound.  I suspect that this is most likely lymph node inflammation secondary to her respiratory infectious process.  I will order a CBC to look for the presence of elevated white blood cell count as well as cell line elevation in the differential.  CBC shows a normal white count of 7.1.  H&H is also normal at 15 and 43.3.  Platelets are low at 138.  Lymphocyte number is slightly decreased at 0.6.  The remainder of the differential is unremarkable.  Strep PCR is negative.  Respiratory panel is negative for COVID, influenza, and RSV.  Given her low lymphocyte number and thrombocytopenia I believe she has a viral infection process.  I will add on a Monospot to her hematology to be in the lab.  Mononucleosis screen is negative.  Will discharge patient with a diagnosis of viral illness and prescribe her Zofran 8 mg ODT that she can use every 8 hours as needed for nausea and vomiting.  Atrovent nasal spray to help with the nasal congestion.  Tessalon Perles and Promethazine DM to help with cough and congestion.  I am going to encourage her to follow-up bland diet for the next several days and then gradually increase her diet as tolerated.  If she develops any worsening symptoms she can either return for reevaluation or seek care in the ER.  Final Clinical Impressions(s) / UC Diagnoses   Final diagnoses:  Viral illness  Nausea and vomiting, unspecified vomiting type  Discharge Instructions      Your testing today was negative for COVID, influenza, RSV, strep, and mononucleosis.  I do believe that you have a viral respiratory infection.  Take over-the-counter Tylenol and/or ibuprofen according to the package instructions as needed for fever and body aches.  Use the Zofran 8 mg every 8 hours as needed for nausea and vomiting.  Follow a clear liquid  diet for the next 6 to 12 hours to see how you tolerate it.  If you are not having any more vomiting you can slowly advance it to a bland diet.  Use the Atrovent nasal spray, 2 squirts of each nostril every 6 hours, as needed for runny nose and nasal congestion.  Gargle with salt water, 1 tablespoon of table salt in 8 ounces of warm water, gargle and spit 2-3 times a day to help with your sore throat.  You can also use over-the-counter Chloraseptic and or Sucrets lozenges as needed for sore throat pain.  Use the Tessalon Perles every 8 hours during the day as needed for cough.  Take them with a small sip of water.  They may give you a numbness to the base of your tongue or metallic taste in her mouth, this is normal.  Use the Promethazine DM cough syrup at bedtime as needed for cough and congestion.  The promethazine will also help you with nausea and sleep.  If you develop any worsening abdominal pain, fevers, you are unable to take and keep down fluids even using the Zofran I suggest that she seek care in the emergency department.  Use the Zofran every 8 hours as needed for nausea and vomiting.  Follow     ED Prescriptions     Medication Sig Dispense Auth. Provider   benzonatate (TESSALON) 100 MG capsule Take 2 capsules (200 mg total) by mouth every 8 (eight) hours. 21 capsule Margarette Canada, NP   ipratropium (ATROVENT) 0.06 % nasal spray Place 2 sprays into both nostrils 4 (four) times daily. 15 mL Margarette Canada, NP   promethazine-dextromethorphan (PROMETHAZINE-DM) 6.25-15 MG/5ML syrup Take 5 mLs by mouth 4 (four) times daily as needed. 118 mL Margarette Canada, NP   ondansetron (ZOFRAN-ODT) 8 MG disintegrating tablet Take 1 tablet (8 mg total) by mouth every 8 (eight) hours as needed for nausea or vomiting. 20 tablet Margarette Canada, NP      PDMP not reviewed this encounter.   Margarette Canada, NP 02/16/22 1319

## 2022-02-16 NOTE — ED Triage Notes (Signed)
Patient presents to [facility] for abdominal pain, cough, sore throat since Thursday. Vomiting and diarrhea since yesterday. Took mucinex.

## 2022-02-16 NOTE — Discharge Instructions (Addendum)
Your testing today was negative for COVID, influenza, RSV, strep, and mononucleosis.  I do believe that you have a viral respiratory infection.  Take over-the-counter Tylenol and/or ibuprofen according to the package instructions as needed for fever and body aches.  Use the Zofran 8 mg every 8 hours as needed for nausea and vomiting.  Follow a clear liquid diet for the next 6 to 12 hours to see how you tolerate it.  If you are not having any more vomiting you can slowly advance it to a bland diet.  Use the Atrovent nasal spray, 2 squirts of each nostril every 6 hours, as needed for runny nose and nasal congestion.  Gargle with salt water, 1 tablespoon of table salt in 8 ounces of warm water, gargle and spit 2-3 times a day to help with your sore throat.  You can also use over-the-counter Chloraseptic and or Sucrets lozenges as needed for sore throat pain.  Use the Tessalon Perles every 8 hours during the day as needed for cough.  Take them with a small sip of water.  They may give you a numbness to the base of your tongue or metallic taste in her mouth, this is normal.  Use the Promethazine DM cough syrup at bedtime as needed for cough and congestion.  The promethazine will also help you with nausea and sleep.  If you develop any worsening abdominal pain, fevers, you are unable to take and keep down fluids even using the Zofran I suggest that she seek care in the emergency department.  Use the Zofran every 8 hours as needed for nausea and vomiting.  Follow

## 2022-03-07 DIAGNOSIS — Z419 Encounter for procedure for purposes other than remedying health state, unspecified: Secondary | ICD-10-CM | POA: Diagnosis not present

## 2022-04-07 DIAGNOSIS — Z419 Encounter for procedure for purposes other than remedying health state, unspecified: Secondary | ICD-10-CM | POA: Diagnosis not present

## 2022-05-07 DIAGNOSIS — Z419 Encounter for procedure for purposes other than remedying health state, unspecified: Secondary | ICD-10-CM | POA: Diagnosis not present

## 2022-06-07 DIAGNOSIS — Z419 Encounter for procedure for purposes other than remedying health state, unspecified: Secondary | ICD-10-CM | POA: Diagnosis not present

## 2022-06-29 ENCOUNTER — Emergency Department
Admission: EM | Admit: 2022-06-29 | Discharge: 2022-06-30 | Disposition: A | Discharge status: Home / Self Care | Payer: BC Managed Care – PPO | Attending: Emergency Medicine | Admitting: Emergency Medicine

## 2022-06-29 ENCOUNTER — Other Ambulatory Visit: Payer: Self-pay

## 2022-06-29 ENCOUNTER — Emergency Department: Payer: BC Managed Care – PPO

## 2022-06-29 DIAGNOSIS — R110 Nausea: Secondary | ICD-10-CM

## 2022-06-29 DIAGNOSIS — R197 Diarrhea, unspecified: Secondary | ICD-10-CM

## 2022-06-29 DIAGNOSIS — R1013 Epigastric pain: Secondary | ICD-10-CM | POA: Insufficient documentation

## 2022-06-29 LAB — COMPREHENSIVE METABOLIC PANEL
ALT: 13 U/L (ref 0–44)
AST: 16 U/L (ref 15–41)
Albumin: 4.5 g/dL (ref 3.5–5.0)
Alkaline Phosphatase: 34 U/L — ABNORMAL LOW (ref 38–126)
Anion gap: 11 (ref 5–15)
BUN: 17 mg/dL (ref 6–20)
CO2: 17 mmol/L — ABNORMAL LOW (ref 22–32)
Calcium: 9.1 mg/dL (ref 8.9–10.3)
Chloride: 107 mmol/L (ref 98–111)
Creatinine, Ser: 0.5 mg/dL (ref 0.44–1.00)
GFR, Estimated: >60 mL/min (ref >60)
Glucose, Bld: 83 mg/dL (ref 70–99)
Potassium: 3.9 mmol/L (ref 3.5–5.1)
Sodium: 135 mmol/L (ref 135–145)
Total Bilirubin: 2.1 mg/dL — ABNORMAL HIGH (ref 0.3–1.2)
Total Protein: 7.5 g/dL (ref 6.5–8.1)

## 2022-06-29 LAB — CBC
HCT: 44.2 % (ref 36.0–46.0)
Hemoglobin: 14.5 g/dL (ref 12.0–15.0)
MCH: 28.2 pg (ref 26.0–34.0)
MCHC: 32.8 g/dL (ref 30.0–36.0)
MCV: 86 fL (ref 80.0–100.0)
Platelets: 144 10*3/uL — ABNORMAL LOW (ref 150–400)
RBC: 5.14 MIL/uL — ABNORMAL HIGH (ref 3.87–5.11)
RDW: 11.7 % (ref 11.5–15.5)
WBC: 10.7 10*3/uL — ABNORMAL HIGH (ref 4.0–10.5)
nRBC: 0 % (ref 0.0–0.2)

## 2022-06-29 LAB — URINALYSIS, ROUTINE W REFLEX MICROSCOPIC
Bacteria, UA: NONE SEEN
Bilirubin Urine: NEGATIVE
Glucose, UA: NEGATIVE mg/dL
Hgb urine dipstick: NEGATIVE
Ketones, ur: 80 mg/dL — AB
Leukocytes,Ua: NEGATIVE
Nitrite: NEGATIVE
Protein, ur: 30 mg/dL — AB
Specific Gravity, Urine: 1.034 — ABNORMAL HIGH (ref 1.005–1.030)
pH: 5 (ref 5.0–8.0)

## 2022-06-29 LAB — LIPASE, BLOOD: Lipase: 27 U/L (ref 11–51)

## 2022-06-29 LAB — POC URINE PREG, ED: Preg Test, Ur: NEGATIVE

## 2022-06-29 MED ORDER — IOHEXOL 300 MG/ML  SOLN
100.0000 mL | Freq: Once | INTRAMUSCULAR | Status: AC | PRN
  Administered 2022-06-29: 100 mL via INTRAVENOUS

## 2022-06-29 MED ORDER — ONDANSETRON 8 MG PO TBDP: 8.0000 mg | ORAL_TABLET | Freq: Three times a day (TID) | ORAL | 0 refills | Status: DC | PRN

## 2022-06-29 MED ORDER — SODIUM CHLORIDE 0.9 % IV BOLUS
1000.0000 mL | Freq: Once | INTRAVENOUS | Status: AC
  Administered 2022-06-29: 1000 mL via INTRAVENOUS

## 2022-06-29 MED ORDER — ONDANSETRON HCL 4 MG/2ML IJ SOLN
4.0000 mg | Freq: Once | INTRAMUSCULAR | Status: AC
  Administered 2022-06-29: 4 mg via INTRAVENOUS
  Filled 2022-06-29: qty 2

## 2022-06-29 NOTE — ED Provider Notes (Incomplete)
[facility] ([facility]) Provider Note   Event Date/Time   First MD Initiated Contact with Patient 06/29/22 2209     (approximate) History  Abdominal Pain (Since AM)  HPI Kelly Harrell is a 22 y.o. female with no previous past medical history who presents complaining of epigastric abdominal pain that has been present since this morning.  Patient states that since she awoke she has been having diarrhea as well as nausea with associated midepigastric abdominal pain of 10/10 and nonradiating.  Patient denies any vomiting.  Patient denies any recent travel or sick contacts.  Patient denies any food out of the ordinary or known food allergies. ROS: Patient currently denies any vision changes, tinnitus, difficulty speaking, facial droop, sore throat, chest pain, shortness of breath, vomiting, dysuria, or weakness/numbness/paresthesias in any extremity   Physical Exam  Triage Vital Signs: ED Triage Vitals  Enc Vitals Group     BP 06/29/22 2118 106/75     Pulse Rate 06/29/22 2118 88     Resp 06/29/22 2118 16     Temp 06/29/22 2118 99 F (37.2 C)     Temp Source 06/29/22 2118 Oral     SpO2 06/29/22 2118 98 %     Weight --      Height 06/29/22 2117 5\' 4"  (1.626 m)     Head Circumference --      Peak Flow --      Pain Score 06/29/22 2116 10     Pain Loc --      Pain Edu? --      Excl. in GC? --    Most recent vital signs: Vitals:   06/30/22 0026 06/30/22 0056  BP: 98/62 102/65  Pulse: (!) 111 98  Resp: 16 16  Temp: 99.5 F (37.5 C) 98.9 F (37.2 C)  SpO2: 100% 100%   General: Awake, oriented x4. CV:  Good peripheral perfusion.  Resp:  Normal effort.  Abd:  No distention.  Epigastric tenderness to palpation without guarding Other:  Young adult [place] female laying in bed in mild distress secondary to pain ED Results / Procedures / Treatments  Labs (all labs ordered are listed, but only abnormal results are displayed) Labs Reviewed  COMPREHENSIVE  METABOLIC PANEL - Abnormal; Notable for the following components:      Result Value   CO2 17 (*)    Alkaline Phosphatase 34 (*)    Total Bilirubin 2.1 (*)    All other components within normal limits  CBC - Abnormal; Notable for the following components:   WBC 10.7 (*)    RBC 5.14 (*)    Platelets 144 (*)    All other components within normal limits  URINALYSIS, ROUTINE W REFLEX MICROSCOPIC - Abnormal; Notable for the following components:   Color, Urine YELLOW (*)    APPearance HAZY (*)    Specific Gravity, Urine 1.034 (*)    Ketones, ur 80 (*)    Protein, ur 30 (*)    All other components within normal limits  LIPASE, BLOOD  POC URINE PREG, ED   RADIOLOGY ED MD interpretation: CT of the abdomen and pelvis with IV contrast interpreted independently by me and shows no evidence of acute abnormalities. -Agree with radiology assessment Official radiology report(s): CT ABDOMEN PELVIS W CONTRAST  Result Date: 06/29/2022 CLINICAL DATA:  Abdominal pain, acute, nonlocalized EXAM: CT ABDOMEN AND PELVIS WITH CONTRAST TECHNIQUE: Multidetector CT imaging of the abdomen and pelvis was performed using the standard protocol following  bolus administration of intravenous contrast. RADIATION DOSE REDUCTION: This exam was performed according to the departmental dose-optimization program which includes automated exposure control, adjustment of the mA and/or kV according to patient size and/or use of iterative reconstruction technique. CONTRAST:  OMNIPAQUE IOHEXOL 300 MG/ML  SOLN COMPARISON:  CT abdomen pelvis 10/26/2016 FINDINGS: Lower chest: No acute abnormality. Hepatobiliary: No focal liver abnormality. No gallstones, gallbladder wall thickening, or pericholecystic fluid. No biliary dilatation. Pancreas: No focal lesion. Normal pancreatic contour. No surrounding inflammatory changes. No main pancreatic ductal dilatation. Spleen: Normal in size without focal abnormality. Adrenals/Urinary Tract: No  adrenal nodule bilaterally. Bilateral kidneys enhance symmetrically. No hydronephrosis. No hydroureter. The urinary bladder is unremarkable. Stomach/Bowel: Stomach is within normal limits. No evidence of bowel wall thickening or dilatation. Appendix appears normal. Vascular/Lymphatic: No abdominal aorta or iliac aneurysm. No abdominal, pelvic, or inguinal lymphadenopathy. Reproductive: Uterus and bilateral adnexa are unremarkable. Other: No intraperitoneal free fluid. No intraperitoneal free gas. No organized fluid collection. Musculoskeletal: No abdominal wall hernia or abnormality. No suspicious lytic or blastic osseous lesions. No acute displaced fracture. IMPRESSION: No acute intra-abdominal or intrapelvic abnormality. Electronically Signed   By: Tish Frederickson M.D.   On: 06/29/2022 23:30   PROCEDURES: Critical Care performed: No .1-3 Lead EKG Interpretation  Performed by: Merwyn Katos, MD Authorized by: Merwyn Katos, MD     Interpretation: normal     ECG rate:  71   ECG rate assessment: normal     Rhythm: sinus rhythm     Ectopy: none     Conduction: normal    MEDICATIONS ORDERED IN ED: Medications  sodium chloride 0.9 % bolus 1,000 mL (0 mLs Intravenous Stopped 06/30/22 0042)  ondansetron (ZOFRAN) injection 4 mg (4 mg Intravenous Given 06/29/22 2308)  iohexol (OMNIPAQUE) 300 MG/ML solution 100 mL (100 mLs Intravenous Contrast Given 06/29/22 2318)   IMPRESSION / MDM / ASSESSMENT AND PLAN / ED COURSE  I reviewed the triage vital signs and the nursing notes.                             The patient is on the cardiac monitor to evaluate for evidence of arrhythmia and/or significant heart rate changes. Patient's presentation is most consistent with acute presentation with potential threat to life or bodily function. This patient presents with diarrhea consistent with likely viral enteritis. Doubt acute bacterial diarrhea. Considered, but think unlikely, partial SBO, appendicitis,  diverticulitis, other intraabdominal infection. Low suspicion for secondary causes of diarrhea such as hyperadrenergic state, pheo, adrenal crisis, hyperthyroidism, or sepsis. Doubt antibiotic associated diarrhea.  Plan: PO rehydration, reassess, discharge with OTC antidiarrheal meds//short course antibiotics  Dispo: Discharge home with PCP follow-up and strict return precautions   FINAL CLINICAL IMPRESSION(S) / ED DIAGNOSES   Final diagnoses:  Nausea  Diarrhea of presumed infectious origin   Rx / DC Orders   ED Discharge Orders          Ordered    ondansetron (ZOFRAN-ODT) 8 MG disintegrating tablet  Every 8 hours PRN        06/29/22 2343           Note:  This document was prepared using Dragon voice recognition software and may include unintentional dictation errors.   Merwyn Katos, MD 06/30/22 Alvina Filbert    Merwyn Katos, MD 06/30/22 2018

## 2022-06-29 NOTE — ED Triage Notes (Signed)
Pt to ed from home via POV for abd pain since this morning. Pt had same happen to her awhile back and was seen and had no answers. Pt is CAOx4, in no acute distress in triage. Pt denies N/V/diarrhea. Pt did have a fever, but didn't take her temperature. She had chills abd body aches.

## 2022-06-30 DIAGNOSIS — R197 Diarrhea, unspecified: Secondary | ICD-10-CM | POA: Diagnosis not present

## 2022-06-30 NOTE — ED Notes (Signed)
Patient reports she was instructed by MD to stay out of work for 5 days to to viral gastro.  Work noted provided

## 2022-07-07 DIAGNOSIS — Z419 Encounter for procedure for purposes other than remedying health state, unspecified: Secondary | ICD-10-CM | POA: Diagnosis not present

## 2022-08-07 DIAGNOSIS — Z419 Encounter for procedure for purposes other than remedying health state, unspecified: Secondary | ICD-10-CM | POA: Diagnosis not present

## 2022-09-07 DIAGNOSIS — Z419 Encounter for procedure for purposes other than remedying health state, unspecified: Secondary | ICD-10-CM | POA: Diagnosis not present

## 2022-10-07 DIAGNOSIS — Z419 Encounter for procedure for purposes other than remedying health state, unspecified: Secondary | ICD-10-CM | POA: Diagnosis not present

## 2022-11-04 ENCOUNTER — Observation Stay
Admission: EM | Admit: 2022-11-04 | Discharge: 2022-11-05 | Disposition: A | Discharge status: Home / Self Care | Payer: BC Managed Care – PPO | Attending: Obstetrics | Admitting: Obstetrics

## 2022-11-04 ENCOUNTER — Other Ambulatory Visit: Payer: Self-pay

## 2022-11-04 ENCOUNTER — Encounter: Payer: Self-pay | Admitting: Emergency Medicine

## 2022-11-04 ENCOUNTER — Emergency Department: Payer: BC Managed Care – PPO

## 2022-11-04 DIAGNOSIS — N83201 Unspecified ovarian cyst, right side: Principal | ICD-10-CM | POA: Insufficient documentation

## 2022-11-04 DIAGNOSIS — R1031 Right lower quadrant pain: Secondary | ICD-10-CM | POA: Diagnosis present

## 2022-11-04 DIAGNOSIS — N83209 Unspecified ovarian cyst, unspecified side: Secondary | ICD-10-CM | POA: Diagnosis present

## 2022-11-04 LAB — URINALYSIS, ROUTINE W REFLEX MICROSCOPIC
Bilirubin Urine: NEGATIVE
Glucose, UA: NEGATIVE mg/dL
Hgb urine dipstick: NEGATIVE
Ketones, ur: NEGATIVE mg/dL
Leukocytes,Ua: NEGATIVE
Nitrite: NEGATIVE
Protein, ur: NEGATIVE mg/dL
Specific Gravity, Urine: 1.016 (ref 1.005–1.030)
pH: 7 (ref 5.0–8.0)

## 2022-11-04 LAB — COMPREHENSIVE METABOLIC PANEL
ALT: 13 U/L (ref 0–44)
AST: 16 U/L (ref 15–41)
Albumin: 3.9 g/dL (ref 3.5–5.0)
Alkaline Phosphatase: 28 U/L — ABNORMAL LOW (ref 38–126)
Anion gap: 5 (ref 5–15)
BUN: 12 mg/dL (ref 6–20)
CO2: 23 mmol/L (ref 22–32)
Calcium: 8.4 mg/dL — ABNORMAL LOW (ref 8.9–10.3)
Chloride: 107 mmol/L (ref 98–111)
Creatinine, Ser: 0.48 mg/dL (ref 0.44–1.00)
GFR, Estimated: >60 mL/min (ref >60)
Glucose, Bld: 88 mg/dL (ref 70–99)
Potassium: 3.6 mmol/L (ref 3.5–5.1)
Sodium: 135 mmol/L (ref 135–145)
Total Bilirubin: 0.7 mg/dL (ref 0.3–1.2)
Total Protein: 6.3 g/dL — ABNORMAL LOW (ref 6.5–8.1)

## 2022-11-04 LAB — CBC WITH DIFFERENTIAL/PLATELET
Abs Immature Granulocytes: 0.02 10*3/uL (ref 0.00–0.07)
Basophils Absolute: 0.1 10*3/uL (ref 0.0–0.1)
Basophils Relative: 1 %
Eosinophils Absolute: 0.2 10*3/uL (ref 0.0–0.5)
Eosinophils Relative: 3 %
HCT: 40.5 % (ref 36.0–46.0)
Hemoglobin: 13.9 g/dL (ref 12.0–15.0)
Immature Granulocytes: 0 %
Lymphocytes Relative: 36 %
Lymphs Abs: 1.7 10*3/uL (ref 0.7–4.0)
MCH: 28.6 pg (ref 26.0–34.0)
MCHC: 34.3 g/dL (ref 30.0–36.0)
MCV: 83.3 fL (ref 80.0–100.0)
Monocytes Absolute: 0.4 10*3/uL (ref 0.1–1.0)
Monocytes Relative: 9 %
Neutro Abs: 2.5 10*3/uL (ref 1.7–7.7)
Neutrophils Relative %: 51 %
Platelets: 175 10*3/uL (ref 150–400)
RBC: 4.86 MIL/uL (ref 3.87–5.11)
RDW: 12.3 % (ref 11.5–15.5)
WBC: 4.8 10*3/uL (ref 4.0–10.5)
nRBC: 0 % (ref 0.0–0.2)

## 2022-11-04 LAB — POC URINE PREG, ED: Preg Test, Ur: NEGATIVE

## 2022-11-04 LAB — LIPASE, BLOOD: Lipase: 30 U/L (ref 11–51)

## 2022-11-04 MED ORDER — IBUPROFEN 600 MG PO TABS
600.0000 mg | ORAL_TABLET | Freq: Four times a day (QID) | ORAL | Status: DC
  Administered 2022-11-04 – 2022-11-05 (×4): 600 mg via ORAL
  Filled 2022-11-04 (×4): qty 1

## 2022-11-04 MED ORDER — ONDANSETRON HCL 4 MG/2ML IJ SOLN: 4.0000 mg | Freq: Four times a day (QID) | INTRAMUSCULAR | Status: DC | PRN

## 2022-11-04 MED ORDER — SOD CITRATE-CITRIC ACID 500-334 MG/5ML PO SOLN: 30.0000 mL | ORAL | Status: DC | PRN

## 2022-11-04 MED ORDER — ONDANSETRON HCL 4 MG/2ML IJ SOLN
4.0000 mg | INTRAMUSCULAR | Status: AC
  Administered 2022-11-04: 4 mg via INTRAVENOUS
  Filled 2022-11-04: qty 2

## 2022-11-04 MED ORDER — KETOROLAC TROMETHAMINE 30 MG/ML IJ SOLN
15.0000 mg | Freq: Once | INTRAMUSCULAR | Status: AC
  Administered 2022-11-04: 15 mg via INTRAVENOUS
  Filled 2022-11-04: qty 1

## 2022-11-04 MED ORDER — OXYCODONE-ACETAMINOPHEN 5-325 MG PO TABS
1.0000 | ORAL_TABLET | ORAL | Status: DC | PRN
  Administered 2022-11-04 – 2022-11-05 (×2): 1 via ORAL
  Filled 2022-11-04: qty 1
  Filled 2022-11-04: qty 2

## 2022-11-04 MED ORDER — HYDROXYZINE HCL 25 MG PO TABS: 50.0000 mg | ORAL_TABLET | Freq: Four times a day (QID) | ORAL | Status: DC | PRN

## 2022-11-04 MED ORDER — MORPHINE SULFATE (PF) 4 MG/ML IV SOLN
4.0000 mg | Freq: Once | INTRAVENOUS | Status: AC
  Administered 2022-11-04: 4 mg via INTRAVENOUS
  Filled 2022-11-04: qty 1

## 2022-11-04 NOTE — ED Notes (Signed)
Pt. Provided [place] sandwich tray. Sitting up in bed. Denies need or complaint.

## 2022-11-04 NOTE — ED Provider Notes (Signed)
  Physical Exam  BP 108/74 (BP Location: Left Arm)   Pulse 81   Temp 98.6 F (37 C) (Oral)   Resp 18   Ht 5\' 4"  (1.626 m)   Wt 52.6 kg   SpO2 98%   BMI 19.91 kg/m    Procedures  Procedures  ED Course / MDM   Clinical Course as of 11/04/22 1451  Tue Nov 04, 2022  0725 Transferring ED care to Dr. Anner Crete to follow up on ultrasound and disposition and treat appropriately.  Lab work is all reassuring and within normal limits [CF]  0824 US PELVIC TRANSABD W/PELVIC DOPPLER 1. No evidence of ovarian torsion. 2. Right ovarian thick-walled cyst, favored to represent a hemorrhagic corpus luteum. Consider follow-up pelvic ultrasound in 6 weeks to ensure resolution given thick-walled nature of this cyst. 3. Moderate volume pelvic free fluid. [DW]  [phone] Will discuss the case with gynecology given prior hx of hemorrhagic cyst rupture requiring surgical intervention [DW]  1025 Gyn - they will review the images and decide on potential surgery vs observe overnight for pain management [DW]    Clinical Course User Index [CF] Loleta Rose, MD [DW] Janith Lima, MD   Medical Decision Making Amount and/or Complexity of Data Reviewed Labs: ordered. Radiology: ordered. Decision-making details documented in ED Course.  Risk Prescription drug management. Decision regarding hospitalization.   Received patient in signout.  22 year old female presenting today for right lower quadrant abdominal pain.  Prior history of ovarian cyst with hemorrhagic rupture requiring surgery.  Laboratory workup reassuring.  Patient signed out pending transvaginal ultrasound to evaluate for ovarian pathology.  Transvaginal ultrasound shows cyst in the right ovary with moderate amount of surrounding free fluid.  Given ongoing pain symptoms after IV medications, gynecology was consulted.  Gynecology will admit patient for observation of the next 24 hours given amount of free fluid in the pelvis along with pain symptoms.   No plan for urgent operative intervention.     Janith Lima, MD 11/04/22 [phone]

## 2022-11-04 NOTE — ED Triage Notes (Signed)
Bib ems for LLQ abd pain, hx of ovarian cyst.  Per pt "the pain is all over in lower abd, started on the left and has moved to the rlq. The last time pain was this bad was back in 2018 and to have sx to remove cyst due to bleeding."  Pt a&o 4  Pain 10/10

## 2022-11-04 NOTE — Consult Note (Signed)
Consult Note  Requesting Attending Physician :  Janith Lima, MD Service Requesting Consult : ED  Assessment/Recommendations:  Kelly Harrell is a 22 y.o. female seen in consultation at the request of Janith Lima, MD regarding pelvic pain and a hemorrhagic cyst with a moderate amount of free fluid in the pelvis. Discussed with Dr. Logan Bores - will review and determine if plan  will be surgical management or observation and pain control.  Active Problems:   * No active hospital problems. *   Reason for Consult:  Hemorrhagic cyst and pelvic pain  Kelly Harrell reports that for the past few days, she has had LRQ with movement and sitting. This morning at 0500, she woke up with severe abdominal pain that felt similar to when she had a cyst in the past. She denies vaginal bleeding. US shows a likely 4.2 cm hemorrhagic cyst on her right ovary. She states that the pain medication has improved her pain, but it still hurts to move, roll, change position, and when her abdomen is touched.  Allergies: NKA  Medications:  No current facility-administered medications on file prior to encounter.   Current Outpatient Medications on File Prior to Encounter  Medication Sig Dispense Refill   albuterol (VENTOLIN HFA) 108 (90 Base) MCG/ACT inhaler Inhale into the lungs.     benzonatate (TESSALON) 100 MG capsule Take 2 capsules (200 mg total) by mouth every 8 (eight) hours. 21 capsule 0   drospirenone-ethinyl estradiol (YAZ) 3-0.02 MG tablet TAKE 1 TABLET BY MOUTH DAILY 84 tablet 3   ipratropium (ATROVENT) 0.06 % nasal spray Place 2 sprays into both nostrils 4 (four) times daily. 15 mL 12   ondansetron (ZOFRAN-ODT) 8 MG disintegrating tablet Take 1 tablet (8 mg total) by mouth every 8 (eight) hours as needed for nausea or vomiting. 20 tablet 0   promethazine-dextromethorphan (PROMETHAZINE-DM) 6.25-15 MG/5ML syrup Take 5 mLs by mouth 4 (four) times daily as needed. 118 mL 0   SUMAtriptan (IMITREX) 50 MG tablet  Take 1 tablet (50 mg total) by mouth every 2 (two) hours as needed for migraine. May repeat in 2 hours if headache persists or recurs. 10 tablet 0     Medical History: Low vitamin D  Surgical History: Laparoscopy in 2018 for hemorrhagic cyst  Obstetric History:  OB History  No obstetric history on file.    GYN History: Hx of Abnormal Pap Smears: no Hx of STIs: no  Social History: Social History   Socioeconomic History   Marital status: Significant Other    Spouse name: Not on file   Number of children: Not on file   Years of education: Not on file   Highest education level: Not on file  Occupational History   Not on file  Tobacco Use   Smoking status: Never   Smokeless tobacco: Never  Vaping Use   Vaping status: Never Used  Substance and Sexual Activity   Alcohol use: No   Drug use: No   Sexual activity: Not on file  Other Topics Concern   Not on file  Social History Narrative   Not on file   Social Determinants of Health   Financial Resource Strain: Low Risk  (09/05/2022)   Received [facility]   Overall Financial Resource Strain (CARDIA)    Difficulty of Paying Living Expenses: Not very hard  Food Insecurity: No Food Insecurity (09/05/2022)   Received from [facility]   Hunger Vital Sign    Worried  About Running Out of Food in the Last Year: Never true    Ran Out of Food in the Last Year: Never true  Transportation Needs: No Transportation Needs (09/05/2022)   Received from [facility] - Transportation    In the past 12 months, has lack of transportation kept you from medical appointments or from getting medications?: No    Lack of Transportation (Non-Medical): No  Physical Activity: Not on file  Stress: Not on file  Social Connections: Not on file    Family History: family history includes Diabetes in her mother; Hypertension in her mother.  Review of Systems: See HPI  Objective  :  Vital signs in last 24 hours: Temp:  [97.7 F (36.5 C)-98.6 F (37 C)] 98.6 F (37 C) (10/29 0719) Pulse Rate:  [65-81] 75 (10/29 0830) Resp:  [18] 18 (10/29 0719) BP: (99-120)/(68-85) 103/74 (10/29 0830) SpO2:  [98 %-100 %] 100 % (10/29 0830) Weight:  [52.6 kg] 52.6 kg (10/29 0553)  Intake/Output last 3 shifts: No intake/output data recorded.   Physical Exam: General: alert, cooperative, NAD Cardiac: RRR Lungs: CTAB Abdomen: soft, normal bowel sounds, tender to palpation in bilateral lower quadrants  I spent 30 minutes involved in the care of this patient preparing to see the patient by obtaining and reviewing her medical history (including labs, imaging tests and prior procedures), documenting clinical information in the electronic health record (EHR), counseling and coordinating care plans, writing and sending prescriptions, ordering tests or procedures and in direct communicating with the patient and medical staff discussing pertinent items from her history and physical exam.   Glenetta Borg, CNM  11/04/2022 10:06 AM

## 2022-11-04 NOTE — H&P (Signed)
GYN H&P  SUBJECTIVE:       Kelly Harrell is a 22 y.o. G0,  No LMP recorded., who is being admitted for pain management for a possible hemorrhagic cyst. She came to the ED today for severe abdominal pain around 0500. She has a h/o laparoscopic surgery for a hemorrhagic cyst in 2018. US shows a 4.2 cm right ovarian cyst which appears hemorrhagic. There is a moderate amount of free fluid in the pelvis. [name] reports that her pain is improved somewhat with the medications she has received in the ED, but it still hurts to move, turn, or be touched.   Gynecologic History LMP: early October Contraception: none, desires OCPs Last Pap: 05/2019. Results were: normal  Obstetric History OB History  No obstetric history on file.    Past Medical History:  Diagnosis Date   Ovarian cyst     Past Surgical History:  Procedure Laterality Date   LAPAROSCOPY N/A 10/26/2016   Procedure: LAPAROSCOPY DIAGNOSTIC;  Surgeon: Nadara Mustard, MD;  Location: [facility] ORS;  Service: Gynecology;  Laterality: N/A;    No current facility-administered medications on file prior to encounter.   Current Outpatient Medications on File Prior to Encounter  Medication Sig Dispense Refill   albuterol (VENTOLIN HFA) 108 (90 Base) MCG/ACT inhaler Inhale into the lungs.     benzonatate (TESSALON) 100 MG capsule Take 2 capsules (200 mg total) by mouth every 8 (eight) hours. 21 capsule 0   ipratropium (ATROVENT) 0.06 % nasal spray Place 2 sprays into both nostrils 4 (four) times daily. 15 mL 12   ondansetron (ZOFRAN-ODT) 8 MG disintegrating tablet Take 1 tablet (8 mg total) by mouth every 8 (eight) hours as needed for nausea or vomiting. 20 tablet 0   promethazine-dextromethorphan (PROMETHAZINE-DM) 6.25-15 MG/5ML syrup Take 5 mLs by mouth 4 (four) times daily as needed. 118 mL 0   SUMAtriptan (IMITREX) 50 MG tablet Take 1 tablet (50 mg total) by mouth every 2 (two) hours as needed for migraine. May repeat in 2 hours if  headache persists or recurs. 10 tablet 0    No Known Allergies  Social History   Socioeconomic History   Marital status: Significant Other    Spouse name: Not on file   Number of children: Not on file   Years of education: Not on file   Highest education level: Not on file  Occupational History   Not on file  Tobacco Use   Smoking status: Never   Smokeless tobacco: Never  Vaping Use   Vaping status: Never Used  Substance and Sexual Activity   Alcohol use: No   Drug use: No   Sexual activity: Not on file  Other Topics Concern   Not on file  Social History Narrative   Not on file   Social Determinants of Health   Financial Resource Strain: Low Risk  (09/05/2022)   Received from [facility]   Overall Financial Resource Strain (CARDIA)    Difficulty of Paying Living Expenses: Not very hard  Food Insecurity: No Food Insecurity (09/05/2022)   Received from [facility]   Hunger Vital Sign    Worried About Running Out of Food in the Last Year: Never true    Ran Out of Food in the Last Year: Never true  Transportation Needs: No Transportation Needs (09/05/2022)   Received from [facility] - Transportation    In the past 12 months, has lack of transportation  kept you from medical appointments or from getting medications?: No    Lack of Transportation (Non-Medical): No  Physical Activity: Not on file  Stress: Not on file  Social Connections: Not on file  Intimate Partner Violence: Not on file    Family History  Problem Relation Age of Onset   Hypertension Mother    Diabetes Mother     The following portions of the patient's history were reviewed and updated as appropriate: allergies, current medications, past OB history, past medical history, past surgical history, past family history, past social history, and problem list.   OBJECTIVE: Initial Physical Exam (New OB)  GENERAL APPEARANCE: alert, well  appearing, in no apparent distress LUNGS: clear to auscultation, no wheezes, rales or rhonchi, symmetric air entry HEART: regular rate and rhythm, no murmurs ABDOMEN: soft, non-distended, normal BS, tender to light palpation in lower quadrant SKIN: normal coloration and turgor, no rashes NEUROLOGIC: alert, oriented, normal speech, no focal findings or movement disorder noted  PELVIC EXAM deferred  ASSESSMENT: Ovarian cyst Pelvic pain  PLAN: -Discussed plan with Dr. Logan Bores, who has reviewed the imaging and spoken with the radiologist. -Admit for observation and pain management -Repeat H&H in AM -Discharge in AM if stable and pain is controlled with plan for f/u US in 6 weeks  Glenetta Borg, CNM

## 2022-11-04 NOTE — ED Provider Notes (Signed)
[facility] Provider Note    Event Date/Time   First MD Initiated Contact with Patient 11/04/22 0559     (approximate)   History   Abdominal Pain   HPI Kelly Harrell is a 22 y.o. female who presents for evaluation of acute onset and severe right sided pelvic pain.  It feels similar to pain she has had in the past when she had a hemorrhagic cyst that required emergent cystectomy.  It woke her up from sleep and is severe.  She had an ultrasound as an outpatient about a month ago with generally reassuring and normal results and no cyst of any substantial size at that time.  She has no concerns for pregnancy.  She has had no nausea or vomiting.     Physical Exam   Triage Vital Signs: ED Triage Vitals  Encounter Vitals Group     BP 11/04/22 0555 120/85     Systolic BP Percentile --      Diastolic BP Percentile --      Pulse Rate 11/04/22 0555 69     Resp --      Temp 11/04/22 0555 97.7 F (36.5 C)     Temp Source 11/04/22 0555 Oral     SpO2 11/04/22 0555 100 %     Weight 11/04/22 0553 52.6 kg (116 lb)     Height 11/04/22 0553 1.626 m (5\' 4" )     Head Circumference --      Peak Flow --      Pain Score 11/04/22 0553 10     Pain Loc --      Pain Education --      Exclude from Growth Chart --     Most recent vital signs: Vitals:   11/04/22 0800 11/04/22 0830  BP: 99/68 103/74  Pulse: 65 75  Resp:    Temp:    SpO2: 98% 100%    General: Awake, severe distress due to pain. CV:  Good peripheral perfusion.  Resp:  Normal effort. Speaking easily and comfortably, no accessory muscle usage nor intercostal retractions.   Abd:  No distention.  Severe tenderness to palpation with localized peritonitis on the right and positive Rovsing sign with tenderness to palpation on the left eliciting more pain on the right.   ED Results / Procedures / Treatments   Labs (all labs ordered are listed, but only abnormal results are displayed) Labs Reviewed   COMPREHENSIVE METABOLIC PANEL - Abnormal; Notable for the following components:      Result Value   Calcium 8.4 (*)    Total Protein 6.3 (*)    Alkaline Phosphatase 28 (*)    All other components within normal limits  URINALYSIS, ROUTINE W REFLEX MICROSCOPIC - Abnormal; Notable for the following components:   Color, Urine YELLOW (*)    APPearance CLEAR (*)    All other components within normal limits  CBC WITH DIFFERENTIAL/PLATELET  LIPASE, BLOOD  POC URINE PREG, ED      RADIOLOGY Transvaginal/abdominal ultrasound pending at time of transfer of care to Dr. Anner Crete   PROCEDURES:  Critical Care performed: No  Procedures    IMPRESSION / MDM / ASSESSMENT AND PLAN / ED COURSE  I reviewed the triage vital signs and the nursing notes.                              Differential diagnosis includes, but is not limited to,  hemorrhagic ovarian cyst, ovarian torsion, less likely appendicitis, STD/PID/TOA.  Patient's presentation is most consistent with acute presentation with potential threat to life or bodily function.  Labs/studies ordered: Pelvic ultrasound with Doppler, urinalysis, urine pregnancy, lipase, CBC with differential, CMP  Interventions/Medications given:  Morphine 4 mg IV, Toradol 15 mg IV, Zofran 4 mg IV (Note:  hospital course my include additional interventions and/or labs/studies not listed above.)   Major concern is for hemorrhagic ovarian cyst versus ovarian torsion, more likely than appendicitis given the acute onset and given the patient's history.  Analgesia and antiemetics as listed above, plan is for basic lab work and ultrasound to rule out torsion and further evaluate adnexal pathology.  Urine pregnancy test is negative.     Clinical Course as of 11/04/22 1110  Tue Nov 04, 2022  0725 Transferring ED care to Dr. Anner Crete to follow up on ultrasound and disposition and treat appropriately.  Lab work is all reassuring and within normal limits [CF]              Clinical Course User Index [CF] Loleta Rose, MD [DW] Janith Lima, MD     FINAL CLINICAL IMPRESSION(S) / ED DIAGNOSES   Severe RLQ abdominal pain  Rx / DC Orders   ED Discharge Orders     None        Note:  This document was prepared using Dragon voice recognition software and may include unintentional dictation errors.   Loleta Rose, MD 11/04/22 1114

## 2022-11-05 DIAGNOSIS — N83201 Unspecified ovarian cyst, right side: Secondary | ICD-10-CM | POA: Diagnosis not present

## 2022-11-05 LAB — CBC
HCT: 38.3 % (ref 36.0–46.0)
Hemoglobin: 13.2 g/dL (ref 12.0–15.0)
MCH: 29 pg (ref 26.0–34.0)
MCHC: 34.5 g/dL (ref 30.0–36.0)
MCV: 84.2 fL (ref 80.0–100.0)
Platelets: 167 10*3/uL (ref 150–400)
RBC: 4.55 MIL/uL (ref 3.87–5.11)
RDW: 12.3 % (ref 11.5–15.5)
WBC: 4.3 10*3/uL (ref 4.0–10.5)
nRBC: 0 % (ref 0.0–0.2)

## 2022-11-05 MED ORDER — OXYCODONE HCL 5 MG PO TABS: 5.0000 mg | ORAL_TABLET | Freq: Four times a day (QID) | ORAL | 0 refills | Status: DC | PRN

## 2022-11-05 NOTE — Progress Notes (Signed)
From 10-29 @ 2PM Spoke with radiology Dr. Roda Shutters and reviewed the ultrasound directly with her.  The free fluid in the pelvis is likely considered a normal variant.  She states there is no debris in this which would be consistent with blood.  There is no stranding which would be consistent with infection.  She once again confirmed that there was no torsion involved.  I have discussed the plan with Chryl Heck, CNM as noted above.  Admit for observation and pain relief.  Likely discharge tomorrow.  If minimal to no improvement consider follow-up ultrasound in AM.

## 2022-11-05 NOTE — Discharge Summary (Signed)
Physician Discharge Summary  Patient ID: Kelly Harrell MRN: [id number] DOB/AGE: 2000/07/08 22 y.o.  Admit date: 11/04/2022 Discharge date: 11/05/2022  Admission Diagnoses:right sided lower quadrant pain History of ovarian cysts  Discharge Diagnoses:  Principal Problem:   Hemorrhagic cyst of ovary   Discharged Condition: good  Hospital Course: the patient was evaluated for acute right sided adnexal pain. Her medical history was noted for ovarian cysts. Ultrasound determined the presence of a hemorrhagic cyst. She was admitted per Dr. Logan Harrell for pain control overnight. Her hemoglobin and Hematocrit remain stable throughout her stay, and her pain is controlled on the morning of admission. She is dischaged home with plans for follow up next week at [facility] GYN with Dr,. Kelly Harrell. A prescription for limited Roxicodone is provided at Discharge.  Consults:  Radiology to discuss the ultrasound  Significant Diagnostic Studies: labs:  Results for orders placed or performed during the hospital encounter of 11/04/22 (from the past 24 hour(s))  CBC     Status: None   Collection Time: 11/05/22  7:41 AM  Result Value Ref Range   WBC 4.3 4.0 - 10.5 K/uL   RBC 4.55 3.87 - 5.11 MIL/uL   Hemoglobin 13.2 12.0 - 15.0 g/dL   HCT 22.0 25.4 - 27.0 %   MCV 84.2 80.0 - 100.0 fL   MCH 29.0 26.0 - 34.0 pg   MCHC 34.5 30.0 - 36.0 g/dL   RDW 62.3 76.2 - 83.1 %   Platelets 167 150 - 400 K/uL   nRBC 0.0 0.0 - 0.2 %   See ultrasound report   Treatments: IV hydration and analgesia: Roxicodone  Discharge Exam: Blood pressure 101/71, pulse 64, temperature 98.4 F (36.9 C), temperature source Oral, resp. rate 18, height 5\' 4"  (1.626 m), weight 52.6 kg, SpO2 100%. General appearance: alert, cooperative, and no distress Resp: clear to auscultation bilaterally Cardio: regular rate and rhythm, S1, S2 normal, no murmur, click, rub or gallop GI: soft, non-tender; bowel sounds normal; no masses,  no  organomegaly Pelvic: deferred  Disposition: Discharge disposition: 01-Home or Self Care       Discharge Instructions     Discharge instructions   Complete by: As directed    Please call [facility] and make an appointment to see Dr. Logan Harrell in one week for followup   Increase activity slowly   Complete by: As directed       Allergies as of 11/05/2022   No Known Allergies      Medication List     TAKE these medications    albuterol 108 (90 Base) MCG/ACT inhaler Commonly known as: VENTOLIN HFA Inhale into the lungs.   benzonatate 100 MG capsule Commonly known as: TESSALON Take 2 capsules (200 mg total) by mouth every 8 (eight) hours.   ipratropium 0.06 % nasal spray Commonly known as: ATROVENT Place 2 sprays into both nostrils 4 (four) times daily.   ondansetron 8 MG disintegrating tablet Commonly known as: ZOFRAN-ODT Take 1 tablet (8 mg total) by mouth every 8 (eight) hours as needed for nausea or vomiting.   oxyCODONE 5 MG immediate release tablet Commonly known as: Oxy IR/ROXICODONE Take 1 tablet (5 mg total) by mouth every 6 (six) hours as needed for severe pain (pain score 7-10).   promethazine-dextromethorphan 6.25-15 MG/5ML syrup Commonly known as: PROMETHAZINE-DM Take 5 mLs by mouth 4 (four) times daily as needed.   rizatriptan 10 MG tablet Commonly known as: MAXALT Take 10 mg by mouth as needed for  migraine. May take a second dose after 2 hours if needed.   SUMAtriptan 50 MG tablet Commonly known as: Imitrex Take 1 tablet (50 mg total) by mouth every 2 (two) hours as needed for migraine. May repeat in 2 hours if headache persists or recurs.   Vitamin D (Ergocalciferol) 1.25 MG (50000 UNIT) Caps capsule Commonly known as: DRISDOL Take 50,000 Units by mouth once a week.        Follow-up Information     Kelly Collin, MD. Schedule an appointment as soon as possible for a visit in 1 week(s).   Specialties: Obstetrics and Gynecology,  Radiology Why: Follow up in one week at [facility] Contact information: [address] [place] [address] [phone]               Will follow up next week at [facility] with Dr. Logan Harrell.  Signed: [name] 11/05/2022, 10:52 AM

## 2022-11-05 NOTE — Progress Notes (Signed)
Patient discharged. Discharge instructions given. Patient verbalizes understanding. Transported by axillary. 

## 2022-11-05 NOTE — Final Progress Note (Signed)
Physician Final Progress Note  Patient ID: Kelly Harrell MRN: [id number] DOB/AGE: 2000/09/05 22 y.o.  Admit date: 11/04/2022 Admitting provider: Linzie Collin, MD Discharge date: 11/05/2022   Admission Diagnoses: right sided acute adnexal pain  Discharge Diagnoses:  Principal Problem:   Hemorrhagic cyst of ovary    Consults: gynecology  Significant Findings/ Diagnostic Studies: labs:  Results for orders placed or performed during the hospital encounter of 11/04/22 (from the past 24 hour(s))  CBC     Status: None   Collection Time: 11/05/22  7:41 AM  Result Value Ref Range   WBC 4.3 4.0 - 10.5 K/uL   RBC 4.55 3.87 - 5.11 MIL/uL   Hemoglobin 13.2 12.0 - 15.0 g/dL   HCT 82.9 56.2 - 13.0 %   MCV 84.2 80.0 - 100.0 fL   MCH 29.0 26.0 - 34.0 pg   MCHC 34.5 30.0 - 36.0 g/dL   RDW 86.5 78.4 - 69.6 %   Platelets 167 150 - 400 K/uL   nRBC 0.0 0.0 - 0.2 %  See Radiology report- likely hemorrhagic cyst right side. Small amount free fluid noted.  Procedures:GYN ultrasound  Discharge Condition: good  Disposition: Discharge disposition: 01-Home or Self Care       Diet: Regular diet  Discharge Activity: Activity as tolerated and Will make appointment with Dr. Brennan Bailey for next week at [facility]  Discharge Instructions     Discharge instructions   Complete by: As directed    Please call  OB GYN and make an appointment to see Dr. Logan Bores in one week for followup   Increase activity slowly   Complete by: As directed       Allergies as of 11/05/2022   No Known Allergies      Medication List     TAKE these medications    albuterol 108 (90 Base) MCG/ACT inhaler Commonly known as: VENTOLIN HFA Inhale into the lungs.   benzonatate 100 MG capsule Commonly known as: TESSALON Take 2 capsules (200 mg total) by mouth every 8 (eight) hours.   ipratropium 0.06 % nasal spray Commonly known as: ATROVENT Place 2 sprays into both nostrils 4 (four) times  daily.   ondansetron 8 MG disintegrating tablet Commonly known as: ZOFRAN-ODT Take 1 tablet (8 mg total) by mouth every 8 (eight) hours as needed for nausea or vomiting.   oxyCODONE 5 MG immediate release tablet Commonly known as: Oxy IR/ROXICODONE Take 1 tablet (5 mg total) by mouth every 6 (six) hours as needed for severe pain (pain score 7-10).   promethazine-dextromethorphan 6.25-15 MG/5ML syrup Commonly known as: PROMETHAZINE-DM Take 5 mLs by mouth 4 (four) times daily as needed.   rizatriptan 10 MG tablet Commonly known as: MAXALT Take 10 mg by mouth as needed for migraine. May take a second dose after 2 hours if needed.   SUMAtriptan 50 MG tablet Commonly known as: Imitrex Take 1 tablet (50 mg total) by mouth every 2 (two) hours as needed for migraine. May repeat in 2 hours if headache persists or recurs.   Vitamin D (Ergocalciferol) 1.25 MG (50000 UNIT) Caps capsule Commonly known as: DRISDOL Take 50,000 Units by mouth once a week.        Follow-up Information     Linzie Collin, MD. Schedule an appointment as soon as possible for a visit in 1 week(s).   Specialties: Obstetrics and Gynecology, Radiology Why: Follow up in one week at [facility] Contact information: [address] [place] [address] [phone]  Total time spent taking care of this patient: 45 minutes  Signed: Mirna Mires 11/05/2022, 11:09 AM

## 2022-11-06 ENCOUNTER — Telehealth: Payer: Self-pay

## 2022-11-06 NOTE — Telephone Encounter (Signed)
Per pharmacist, not sure why med was denied originally as not covered. States it is now covered under $4 copay and patient has picked up rx.

## 2022-11-07 DIAGNOSIS — Z419 Encounter for procedure for purposes other than remedying health state, unspecified: Secondary | ICD-10-CM | POA: Diagnosis not present

## 2022-11-12 ENCOUNTER — Ambulatory Visit (INDEPENDENT_AMBULATORY_CARE_PROVIDER_SITE_OTHER): Payer: BC Managed Care – PPO | Admitting: Obstetrics and Gynecology

## 2022-11-12 ENCOUNTER — Encounter: Payer: Self-pay | Admitting: Obstetrics and Gynecology

## 2022-11-12 VITALS — BP 105/70 | HR 64 | Ht 64.0 in | Wt 118.8 lb

## 2022-11-12 DIAGNOSIS — Z7689 Persons encountering health services in other specified circumstances: Secondary | ICD-10-CM

## 2022-11-12 DIAGNOSIS — Z30011 Encounter for initial prescription of contraceptive pills: Secondary | ICD-10-CM | POA: Diagnosis not present

## 2022-11-12 DIAGNOSIS — N83201 Unspecified ovarian cyst, right side: Secondary | ICD-10-CM

## 2022-11-12 MED ORDER — DESOGESTREL-ETHINYL ESTRADIOL 0.15-0.02/0.01 MG (21/5) PO TABS: 1.0000 | ORAL_TABLET | Freq: Every day | ORAL | 1 refills | Status: DC

## 2022-11-12 NOTE — Progress Notes (Signed)
HPI:      [name] is a 22 y.o. G0P0000 who LMP was Patient's last menstrual period was 11/09/2022 (exact date).  Subjective:   She presents today for follow-up of a hemorrhagic ovarian cyst.  She reports her pain is significantly improving since her hospital discharge.  She did have a menstrual period that she is currently on right now.  She states it is heavier than usual.  She is interested in cycle control at this time.  She has previously been on OCPs successfully.    Hx: The following portions of the patient's history were reviewed and updated as appropriate:             She  has a past medical history of Ovarian cyst. She does not have any pertinent problems on file. She  has a past surgical history that includes laparoscopy (N/A, 10/26/2016). Her family history includes Diabetes in her mother; Hypertension in her mother. She  reports that she has never smoked. She has never used smokeless tobacco. She reports that she does not drink alcohol and does not use drugs. She has a current medication list which includes the following prescription(s): desogestrel-ethinyl estradiol, vitamin d (ergocalciferol), albuterol, benzonatate, ipratropium, ondansetron, oxycodone, promethazine-dextromethorphan, rizatriptan, and sumatriptan. She has No Known Allergies.       Review of Systems:  Review of Systems  Constitutional: Denied constitutional symptoms, night sweats, recent illness, fatigue, fever, insomnia and weight loss.  Eyes: Denied eye symptoms, eye pain, photophobia, vision change and visual disturbance.  Ears/Nose/Throat/Neck: Denied ear, nose, throat or neck symptoms, hearing loss, nasal discharge, sinus congestion and sore throat.  Cardiovascular: Denied cardiovascular symptoms, arrhythmia, chest pain/pressure, edema, exercise intolerance, orthopnea and palpitations.  Respiratory: Denied pulmonary symptoms, asthma, pleuritic pain, productive sputum, cough, dyspnea and  wheezing.  Gastrointestinal: Denied, gastro-esophageal reflux, melena, nausea and vomiting.  Genitourinary: Denied genitourinary symptoms including symptomatic vaginal discharge, pelvic relaxation issues, and urinary complaints.  Musculoskeletal: Denied musculoskeletal symptoms, stiffness, swelling, muscle weakness and myalgia.  Dermatologic: Denied dermatology symptoms, rash and scar.  Neurologic: Denied neurology symptoms, dizziness, headache, neck pain and syncope.  Psychiatric: Denied psychiatric symptoms, anxiety and depression.  Endocrine: Denied endocrine symptoms including hot flashes and night sweats.   Meds:   Current Outpatient Medications on File Prior to Visit  Medication Sig Dispense Refill   Vitamin D, Ergocalciferol, (DRISDOL) 1.25 MG (50000 UNIT) CAPS capsule Take 50,000 Units by mouth once a week.     albuterol (VENTOLIN HFA) 108 (90 Base) MCG/ACT inhaler Inhale into the lungs.     benzonatate (TESSALON) 100 MG capsule Take 2 capsules (200 mg total) by mouth every 8 (eight) hours. (Patient not taking: Reported on 11/04/2022) 21 capsule 0   ipratropium (ATROVENT) 0.06 % nasal spray Place 2 sprays into both nostrils 4 (four) times daily. (Patient not taking: Reported on 11/04/2022) 15 mL 12   ondansetron (ZOFRAN-ODT) 8 MG disintegrating tablet Take 1 tablet (8 mg total) by mouth every 8 (eight) hours as needed for nausea or vomiting. (Patient not taking: Reported on 11/04/2022) 20 tablet 0   oxyCODONE (OXY IR/ROXICODONE) 5 MG immediate release tablet Take 1 tablet (5 mg total) by mouth every 6 (six) hours as needed for severe pain (pain score 7-10). (Patient not taking: Reported on 11/12/2022) 15 tablet 0   promethazine-dextromethorphan (PROMETHAZINE-DM) 6.25-15 MG/5ML syrup Take 5 mLs by mouth 4 (four) times daily as needed. (Patient not taking: Reported on 11/04/2022) 118 mL 0   rizatriptan (MAXALT) 10 MG  tablet Take 10 mg by mouth as needed for migraine. May take a second dose  after 2 hours if needed. (Patient not taking: Reported on 11/04/2022)     SUMAtriptan (IMITREX) 50 MG tablet Take 1 tablet (50 mg total) by mouth every 2 (two) hours as needed for migraine. May repeat in 2 hours if headache persists or recurs. (Patient not taking: Reported on 11/04/2022) 10 tablet 0   No current facility-administered medications on file prior to visit.      Objective:     Vitals:   11/12/22 0819  BP: 105/70  Pulse: 64   Filed Weights   11/12/22 0819  Weight: 118 lb 12.8 oz (53.9 kg)                        Assessment:    G0P0000 Patient Active Problem List   Diagnosis Date Noted   Hemorrhagic cyst of ovary 11/04/2022   Encounter for surveillance of contraceptive pills 05/18/2017   Hemorrhagic ovarian cyst 10/26/2016   Abdominal pain, chronic, right lower quadrant 10/26/2016   Post-concussion headache 06/08/2015     1. Establishing care with new doctor, encounter for   2. Right ovarian cyst   3. Initiation of OCP (BCP)     Patient reports her pain is significantly improved but not gone yet.  She is not taking any medication for pain relief.  She is interested in cycle control OCPs.   Plan:            1.  Expectant management of ovarian cyst.  Expect resolution by 3 months.  If pain not gone by 6 weeks consider repeat ultrasound.  2.  OCPs The risks /benefits of OCPs have been explained to the patient in detail.  Product literature has been given to her where appropriate.  I have instructed her in the use of OCPs.  I have explained to the patient that OCPs are not as effective for birth control during the first month of use, and that another form of contraception should be used during this time.  Both first-day start and Sunday start have been explained.  The risks and benefits of each was discussed.  She has been made aware of  the fact that in rare circumstances, other medications may affect the efficacy of OCPs.  I have answered all of her questions,  and I believe that she has an understanding of the effectiveness and use of OCPs. Patient will start OCPs for cycle control.  Instructed in their use. Orders No orders of the defined types were placed in this encounter.    Meds ordered this encounter  Medications   desogestrel-ethinyl estradiol (MIRCETTE) 0.15-0.02/0.01 MG (21/5) tablet    Sig: Take 1 tablet by mouth at bedtime.    Dispense:  84 tablet    Refill:  1      F/U  Return in about 3 months (around 02/12/2023). I spent 22 minutes involved in the care of this patient preparing to see the patient by obtaining and reviewing her medical history (including labs, imaging tests and prior procedures), documenting clinical information in the electronic health record (EHR), counseling and coordinating care plans, writing and sending prescriptions, ordering tests or procedures and in direct communicating with the patient and medical staff discussing pertinent items from her history and physical exam.  Elonda Husky, M.D. 11/12/2022 8:51 AM

## 2022-11-12 NOTE — Progress Notes (Signed)
Patient presents today to follow-up on an ovarian cyst. She states her pain has lessened but she just finished a heavier and painful cycle.

## 2022-12-07 DIAGNOSIS — Z419 Encounter for procedure for purposes other than remedying health state, unspecified: Secondary | ICD-10-CM | POA: Diagnosis not present

## 2023-01-07 DIAGNOSIS — Z419 Encounter for procedure for purposes other than remedying health state, unspecified: Secondary | ICD-10-CM | POA: Diagnosis not present

## 2023-01-08 DIAGNOSIS — Z20822 Contact with and (suspected) exposure to covid-19: Secondary | ICD-10-CM | POA: Diagnosis not present

## 2023-01-08 DIAGNOSIS — R109 Unspecified abdominal pain: Secondary | ICD-10-CM | POA: Diagnosis not present

## 2023-01-08 DIAGNOSIS — K529 Noninfective gastroenteritis and colitis, unspecified: Secondary | ICD-10-CM | POA: Diagnosis not present

## 2023-02-07 DIAGNOSIS — Z419 Encounter for procedure for purposes other than remedying health state, unspecified: Secondary | ICD-10-CM | POA: Diagnosis not present

## 2023-03-07 DIAGNOSIS — Z419 Encounter for procedure for purposes other than remedying health state, unspecified: Secondary | ICD-10-CM | POA: Diagnosis not present

## 2023-03-20 ENCOUNTER — Other Ambulatory Visit: Payer: Self-pay | Admitting: Obstetrics and Gynecology

## 2023-03-20 DIAGNOSIS — N83201 Unspecified ovarian cyst, right side: Secondary | ICD-10-CM

## 2023-04-18 DIAGNOSIS — Z419 Encounter for procedure for purposes other than remedying health state, unspecified: Secondary | ICD-10-CM | POA: Diagnosis not present

## 2023-05-03 DIAGNOSIS — R399 Unspecified symptoms and signs involving the genitourinary system: Secondary | ICD-10-CM | POA: Diagnosis not present

## 2023-05-05 ENCOUNTER — Other Ambulatory Visit: Payer: Self-pay

## 2023-05-05 DIAGNOSIS — N83201 Unspecified ovarian cyst, right side: Secondary | ICD-10-CM

## 2023-05-07 ENCOUNTER — Ambulatory Visit
Admission: RE | Admit: 2023-05-07 | Discharge: 2023-05-07 | Disposition: A | Discharge status: Home / Self Care | Source: Ambulatory Visit | Attending: Obstetrics and Gynecology | Admitting: Obstetrics and Gynecology

## 2023-05-07 ENCOUNTER — Other Ambulatory Visit: Payer: Self-pay | Admitting: Obstetrics and Gynecology

## 2023-05-07 DIAGNOSIS — N83201 Unspecified ovarian cyst, right side: Secondary | ICD-10-CM | POA: Diagnosis not present

## 2023-05-07 DIAGNOSIS — R102 Pelvic and perineal pain: Secondary | ICD-10-CM | POA: Diagnosis not present

## 2023-05-12 ENCOUNTER — Ambulatory Visit: Admitting: Obstetrics and Gynecology

## 2023-05-12 ENCOUNTER — Encounter: Payer: Self-pay | Admitting: Obstetrics and Gynecology

## 2023-05-12 DIAGNOSIS — Z8742 Personal history of other diseases of the female genital tract: Secondary | ICD-10-CM

## 2023-05-12 DIAGNOSIS — N83201 Unspecified ovarian cyst, right side: Secondary | ICD-10-CM

## 2023-05-13 ENCOUNTER — Encounter: Payer: Self-pay | Admitting: Obstetrics and Gynecology

## 2023-05-13 ENCOUNTER — Telehealth (INDEPENDENT_AMBULATORY_CARE_PROVIDER_SITE_OTHER): Admitting: Obstetrics and Gynecology

## 2023-05-13 DIAGNOSIS — N83201 Unspecified ovarian cyst, right side: Secondary | ICD-10-CM | POA: Diagnosis not present

## 2023-05-13 MED ORDER — DESOGESTREL-ETHINYL ESTRADIOL 0.15-0.02/0.01 MG (21/5) PO TABS: 1.0000 | ORAL_TABLET | Freq: Every day | ORAL | 2 refills | Status: DC

## 2023-05-13 NOTE — Progress Notes (Signed)
 Virtual Visit via Video Note  I connected with Kelly Harrell on 05/13/23 at  7:55 AM EDT by video and verified that I was speaking with the correct person using two identifiers.    Kelly Harrell is a 23 y.o. G0P0000 who LMP was No LMP recorded. I discussed the limitations, risks, security and privacy concerns of performing an evaluation and management service by video and the availability of in person appointments. I also discussed with the patient that there may be a patient responsible charge related to this service. The patient expressed understanding and agreed to proceed.  Location of patient: Home  Patient gave explicit verbal consent for video visit:  YES  Location of provider:  [facility] office  Persons other than physician and patient involved in provider conference:  None   Subjective:   History of Present Illness:    History of hemorrhagic ovarian cyst.  She underwent a follow-up ultrasound to see if it was still present. Asked her today how she is feeling and she says she is doing much better has no further pelvic pain. She started OCPs for cycle control and prevention of ovarian cysts.  She says they are working well and she is having normal regular cycles.  She would like to continue them.  Hx: The following portions of the patient's history were reviewed and updated as appropriate:             She  has a past medical history of Ovarian cyst. She does not have any pertinent problems on file. She  has a past surgical history that includes laparoscopy (N/A, 10/26/2016). Her family history includes Diabetes in her mother; Hypertension in her mother. She  reports that she has never smoked. She has never used smokeless tobacco. She reports that she does not drink alcohol and does not use drugs. She has a current medication list which includes the following prescription(s): albuterol , desogestrel -ethinyl estradiol , and vitamin d (ergocalciferol). She has no known  allergies.       Review of Systems:  Review of Systems  Constitutional: Denied constitutional symptoms, night sweats, recent illness, fatigue, fever, insomnia and weight loss.  Eyes: Denied eye symptoms, eye pain, photophobia, vision change and visual disturbance.  Ears/Nose/Throat/Neck: Denied ear, nose, throat or neck symptoms, hearing loss, nasal discharge, sinus congestion and sore throat.  Cardiovascular: Denied cardiovascular symptoms, arrhythmia, chest pain/pressure, edema, exercise intolerance, orthopnea and palpitations.  Respiratory: Denied pulmonary symptoms, asthma, pleuritic pain, productive sputum, cough, dyspnea and wheezing.  Gastrointestinal: Denied, gastro-esophageal reflux, melena, nausea and vomiting.  Genitourinary: Denied genitourinary symptoms including symptomatic vaginal discharge, pelvic relaxation issues, and urinary complaints.  Musculoskeletal: Denied musculoskeletal symptoms, stiffness, swelling, muscle weakness and myalgia.  Dermatologic: Denied dermatology symptoms, rash and scar.  Neurologic: Denied neurology symptoms, dizziness, headache, neck pain and syncope.  Psychiatric: Denied psychiatric symptoms, anxiety and depression.  Endocrine: Denied endocrine symptoms including hot flashes and night sweats.   Meds:   Current Outpatient Medications on File Prior to Visit  Medication Sig Dispense Refill   albuterol  (VENTOLIN  HFA) 108 (90 Base) MCG/ACT inhaler Inhale into the lungs.     Vitamin D, Ergocalciferol, (DRISDOL) 1.25 MG (50000 UNIT) CAPS capsule Take 50,000 Units by mouth once a week.     No current facility-administered medications on file prior to visit.    Assessment:    G0P0000 Patient Active Problem List   Diagnosis Date Noted   Hemorrhagic cyst of ovary 11/04/2022   Encounter for surveillance of contraceptive  pills 05/18/2017   Hemorrhagic ovarian cyst 10/26/2016   Abdominal pain, chronic, right lower quadrant 10/26/2016    Post-concussion headache 06/08/2015     1. Right ovarian cyst     This has resolved by ultrasound and she is doing well without pelvic pain.  Plan:            1.  Continue OCPs.  Follow-up for annual exam. Orders No orders of the defined types were placed in this encounter.    Meds ordered this encounter  Medications   desogestrel -ethinyl estradiol  (VIORELE) 0.15-0.02/0.01 MG (21/5) tablet    Sig: Take 1 tablet by mouth at bedtime.    Dispense:  84 tablet    Refill:  2      F/U  Return for Annual Physical.   Delice Felt, M.D. 05/13/2023 8:16 AM

## 2023-05-18 DIAGNOSIS — Z419 Encounter for procedure for purposes other than remedying health state, unspecified: Secondary | ICD-10-CM | POA: Diagnosis not present

## 2023-06-18 DIAGNOSIS — Z419 Encounter for procedure for purposes other than remedying health state, unspecified: Secondary | ICD-10-CM | POA: Diagnosis not present

## 2023-06-23 ENCOUNTER — Other Ambulatory Visit: Payer: Self-pay | Admitting: Obstetrics and Gynecology

## 2023-06-23 DIAGNOSIS — N83201 Unspecified ovarian cyst, right side: Secondary | ICD-10-CM

## 2023-07-18 DIAGNOSIS — Z419 Encounter for procedure for purposes other than remedying health state, unspecified: Secondary | ICD-10-CM | POA: Diagnosis not present

## 2023-07-20 DIAGNOSIS — R109 Unspecified abdominal pain: Secondary | ICD-10-CM | POA: Diagnosis not present

## 2023-07-20 DIAGNOSIS — N926 Irregular menstruation, unspecified: Secondary | ICD-10-CM | POA: Diagnosis not present

## 2023-08-18 DIAGNOSIS — Z419 Encounter for procedure for purposes other than remedying health state, unspecified: Secondary | ICD-10-CM | POA: Diagnosis not present

## 2023-09-18 DIAGNOSIS — Z419 Encounter for procedure for purposes other than remedying health state, unspecified: Secondary | ICD-10-CM | POA: Diagnosis not present

## 2023-09-20 ENCOUNTER — Other Ambulatory Visit: Payer: Self-pay

## 2023-09-20 DIAGNOSIS — R1030 Lower abdominal pain, unspecified: Secondary | ICD-10-CM | POA: Diagnosis not present

## 2023-09-20 DIAGNOSIS — O26891 Other specified pregnancy related conditions, first trimester: Secondary | ICD-10-CM | POA: Insufficient documentation

## 2023-09-20 DIAGNOSIS — Z3A01 Less than 8 weeks gestation of pregnancy: Secondary | ICD-10-CM | POA: Insufficient documentation

## 2023-09-20 DIAGNOSIS — R109 Unspecified abdominal pain: Secondary | ICD-10-CM | POA: Diagnosis not present

## 2023-09-20 DIAGNOSIS — Z3A00 Weeks of gestation of pregnancy not specified: Secondary | ICD-10-CM | POA: Diagnosis not present

## 2023-09-20 LAB — CBC
HCT: 40.6 % (ref 36.0–46.0)
Hemoglobin: 13.6 g/dL (ref 12.0–15.0)
MCH: 27.7 pg (ref 26.0–34.0)
MCHC: 33.5 g/dL (ref 30.0–36.0)
MCV: 82.7 fL (ref 80.0–100.0)
Platelets: 194 K/uL (ref 150–400)
RBC: 4.91 MIL/uL (ref 3.87–5.11)
RDW: 12.2 % (ref 11.5–15.5)
WBC: 5.6 K/uL (ref 4.0–10.5)
nRBC: 0 % (ref 0.0–0.2)

## 2023-09-20 LAB — POC URINE PREG, ED: Preg Test, Ur: POSITIVE — AB

## 2023-09-20 NOTE — ED Triage Notes (Signed)
 Patient ambulatory to triage with complaints of worsening lower abdominal pain. Patient denies vomiting/diarrhea. Endorses some nausea. Patient endorses positive pregnancy test at home, LMP 08/19/23, 1st pregnancy. Denies vaginal bleeding.

## 2023-09-21 ENCOUNTER — Emergency Department
Admission: EM | Admit: 2023-09-21 | Discharge: 2023-09-21 | Disposition: A | Discharge status: Home / Self Care | Attending: Emergency Medicine | Admitting: Emergency Medicine

## 2023-09-21 ENCOUNTER — Emergency Department

## 2023-09-21 DIAGNOSIS — R109 Unspecified abdominal pain: Secondary | ICD-10-CM

## 2023-09-21 DIAGNOSIS — Z3A00 Weeks of gestation of pregnancy not specified: Secondary | ICD-10-CM | POA: Diagnosis not present

## 2023-09-21 LAB — COMPREHENSIVE METABOLIC PANEL WITH GFR
ALT: 14 U/L (ref 0–44)
AST: 17 U/L (ref 15–41)
Albumin: 4.1 g/dL (ref 3.5–5.0)
Alkaline Phosphatase: 43 U/L (ref 38–126)
Anion gap: 9 (ref 5–15)
BUN: 11 mg/dL (ref 6–20)
CO2: 21 mmol/L — ABNORMAL LOW (ref 22–32)
Calcium: 8.6 mg/dL — ABNORMAL LOW (ref 8.9–10.3)
Chloride: 106 mmol/L (ref 98–111)
Creatinine, Ser: 0.4 mg/dL — ABNORMAL LOW (ref 0.44–1.00)
GFR, Estimated: >60 mL/min (ref >60)
Glucose, Bld: 115 mg/dL — ABNORMAL HIGH (ref 70–99)
Potassium: 3.6 mmol/L (ref 3.5–5.1)
Sodium: 136 mmol/L (ref 135–145)
Total Bilirubin: 0.5 mg/dL (ref 0.0–1.2)
Total Protein: 7.1 g/dL (ref 6.5–8.1)

## 2023-09-21 LAB — URINALYSIS, ROUTINE W REFLEX MICROSCOPIC
Bilirubin Urine: NEGATIVE
Glucose, UA: NEGATIVE mg/dL
Hgb urine dipstick: NEGATIVE
Ketones, ur: NEGATIVE mg/dL
Leukocytes,Ua: NEGATIVE
Nitrite: NEGATIVE
Protein, ur: NEGATIVE mg/dL
Specific Gravity, Urine: 1.014 (ref 1.005–1.030)
pH: 6 (ref 5.0–8.0)

## 2023-09-21 LAB — HCG, QUANTITATIVE, PREGNANCY: hCG, Beta Chain, Quant, S: 1285 m[IU]/mL — ABNORMAL HIGH (ref <5)

## 2023-09-21 LAB — LIPASE, BLOOD: Lipase: 42 U/L (ref 11–51)

## 2023-09-21 NOTE — ED Notes (Signed)
Patient given discharge instructions including importance of follow up appt as needed with stated understanding. Patient stable and ambulatory with steady even gait on dispo.

## 2023-09-21 NOTE — ED Provider Notes (Signed)
 [facility] Provider Note    Event Date/Time   First MD Initiated Contact with Patient 09/21/23 [phone]     (approximate)   History   Abdominal Pain   HPI Kelly Harrell is a 23 y.o. female G1, P0 early in pregnancy (last menstrual period was 1 month ago) who presents for evaluation of lower abdominal pain and cramping.  She said that she has been having some pain in her lower abdomen that is intermittent but sometimes is very strong, like a menstrual cramp but stronger.  She did a pregnancy test at home which was positive.  She has no pain or burning when she urinates.  The pain is not constant.  She has not nearly passed out.  She has had no vaginal bleeding.     Physical Exam   Triage Vital Signs: ED Triage Vitals [09/20/23 2340]  Encounter Vitals Group     BP 121/76     Girls Systolic BP Percentile      Girls Diastolic BP Percentile      Boys Systolic BP Percentile      Boys Diastolic BP Percentile      Pulse Rate 99     Resp 18     Temp 98.3 F (36.8 C)     Temp Source Oral     SpO2 100 %     Weight 53.5 kg (118 lb)     Height 1.651 m (5' 5)     Head Circumference      Peak Flow      Pain Score 8     Pain Loc      Pain Education      Exclude from Growth Chart     Most recent vital signs: Vitals:   09/21/23 0346 09/21/23 0624  BP: 106/69 98/66  Pulse: 75 77  Resp: 18 18  Temp: 97.8 F (36.6 C) 97.6 F (36.4 C)  SpO2: 99% 98%    General: Awake, no obvious distress, pleasant and conversant. CV:  Good peripheral perfusion.   Resp:  Normal effort. Speaking easily and comfortably, no accessory muscle usage nor intercostal retractions.   Abd:  Soft, no distention.  No tenderness to palpation of the abdomen at this time with no rebound and no guarding.   ED Results / Procedures / Treatments   Labs (all labs ordered are listed, but only abnormal results are displayed) Labs Reviewed  COMPREHENSIVE METABOLIC PANEL WITH GFR -  Abnormal; Notable for the following components:      Result Value   CO2 21 (*)    Glucose, Bld 115 (*)    Creatinine, Ser 0.40 (*)    Calcium 8.6 (*)    All other components within normal limits  URINALYSIS, ROUTINE W REFLEX MICROSCOPIC - Abnormal; Notable for the following components:   Color, Urine YELLOW (*)    APPearance CLEAR (*)    All other components within normal limits  HCG, QUANTITATIVE, PREGNANCY - Abnormal; Notable for the following components:   hCG, Beta Chain, Quant, S 1,285 (*)    All other components within normal limits  POC URINE PREG, ED - Abnormal; Notable for the following components:   Preg Test, Ur Positive (*)    All other components within normal limits  LIPASE, BLOOD  CBC      RADIOLOGY See ED course for details   PROCEDURES:  Critical Care performed: No  Procedures    IMPRESSION / MDM / ASSESSMENT AND PLAN /  ED COURSE  I reviewed the triage vital signs and the nursing notes.                              Differential diagnosis includes, but is not limited to, ectopic pregnancy, pain associated with early pregnancy, less likely appendicitis or diverticulitis.  Patient's presentation is most consistent with acute presentation with potential threat to life or bodily function.  Labs/studies ordered: Urine pregnancy, beta-hCG, CMP, urinalysis, lipase, CBC, OB ultrasound  Interventions/Medications given:  Medications - No data to display  (Note:  hospital course my include additional interventions and/or labs/studies not listed above.)   Exam is not consistent with acute intra-abdominal infection as she has no tenderness and no guarding at this time.  Beta-hCG very low at about 1300.  Will proceed with ultrasound but I prepared the patient and her father (who is at bedside with her permission) that it is likely that the ultrasound will be nondiagnostic and will require outpatient follow-up.  There is no indication she requires additional  imaging such as an MRI or CT scan.  She agrees with the plan.     Clinical Course as of 09/21/23 0636  Mon Sep 21, 2023  0616 US  OB LESS THAN 14 WEEKS WITH OB TRANSVAGINAL I independently viewed and interpret the patient's ultrasound and I cannot see an intrauterine pregnancy.  Radiology confirmed no gestational sac, difficult to assess but most likely too early. [CF]  9382 I reassessed the patient and she says she is ready to go home.  No pain at this time.  I had my usual and customary discussion about early pregnancy and the need for repeat hCG level and repeat ultrasound in the timing of those studies.  She already has plans to see OB/GYN in [place] and does not need local provider names.  I gave my usual and customary follow-up recommendations and return precautions. [CF]    Clinical Course User Index [CF] Gordan Huxley, MD     FINAL CLINICAL IMPRESSION(S) / ED DIAGNOSES   Final diagnoses:  Abdominal pain in early pregnancy     Rx / DC Orders   ED Discharge Orders     None        Note:  This document was prepared using Dragon voice recognition software and may include unintentional dictation errors.   Gordan Huxley, MD 09/21/23 [phone]

## 2023-09-21 NOTE — Discharge Instructions (Addendum)
 As we discussed, based on your hCG level (the blood pregnancy test), your pregnancy is very early, likely in the 3-week range.  It is too early to see anything on your ultrasound.  We recommend that you follow-up with your primary care doctor or with an OB/GYN provider.  Your beta-hCG level tonight was 1285, and it typically doubles about every 48 hours.  If you follow-up later this week you should see that this number has gone up appropriately.  We recommend you have a repeat ultrasound within about 2 weeks and hopefully at that time we will be able to provide more information.  If you develop any new or worsening symptoms that concern you, including vaginal bleeding or worsening pain, please return to the nearest emergency department.

## 2023-09-21 NOTE — ED Notes (Signed)
 US  at bedside.
